# Patient Record
Sex: Female | Born: 1935 | Race: White | Hispanic: No | Smoking: Never smoker
Health system: Southern US, Community
[De-identification: ages and names within clinical notes are randomized; demographics above are authoritative.]

## PROBLEM LIST (undated history)

## (undated) DIAGNOSIS — M76899 Other specified enthesopathies of unspecified lower limb, excluding foot: Secondary | ICD-10-CM

## (undated) DIAGNOSIS — M545 Low back pain, unspecified: Secondary | ICD-10-CM

## (undated) DIAGNOSIS — N182 Chronic kidney disease, stage 2 (mild): Secondary | ICD-10-CM

## (undated) DIAGNOSIS — M25519 Pain in unspecified shoulder: Secondary | ICD-10-CM

## (undated) DIAGNOSIS — L858 Other specified epidermal thickening: Secondary | ICD-10-CM

## (undated) DIAGNOSIS — M6530 Trigger finger, unspecified finger: Secondary | ICD-10-CM

## (undated) DIAGNOSIS — K117 Disturbances of salivary secretion: Secondary | ICD-10-CM

## (undated) DIAGNOSIS — I5030 Unspecified diastolic (congestive) heart failure: Secondary | ICD-10-CM

## (undated) DIAGNOSIS — M48061 Spinal stenosis, lumbar region without neurogenic claudication: Secondary | ICD-10-CM

## (undated) DIAGNOSIS — I10 Essential (primary) hypertension: Secondary | ICD-10-CM

## (undated) DIAGNOSIS — L570 Actinic keratosis: Secondary | ICD-10-CM

## (undated) DIAGNOSIS — E039 Hypothyroidism, unspecified: Secondary | ICD-10-CM

## (undated) DIAGNOSIS — M1990 Unspecified osteoarthritis, unspecified site: Secondary | ICD-10-CM

## (undated) DIAGNOSIS — I442 Atrioventricular block, complete: Principal | ICD-10-CM

## (undated) DIAGNOSIS — H18609 Keratoconus, unspecified, unspecified eye: Secondary | ICD-10-CM

## (undated) DIAGNOSIS — I4891 Unspecified atrial fibrillation: Secondary | ICD-10-CM

## (undated) DIAGNOSIS — I872 Venous insufficiency (chronic) (peripheral): Secondary | ICD-10-CM

## (undated) DIAGNOSIS — H16209 Unspecified keratoconjunctivitis, unspecified eye: Secondary | ICD-10-CM

## (undated) DIAGNOSIS — L821 Other seborrheic keratosis: Secondary | ICD-10-CM

## (undated) DIAGNOSIS — E782 Mixed hyperlipidemia: Secondary | ICD-10-CM

## (undated) DIAGNOSIS — K219 Gastro-esophageal reflux disease without esophagitis: Secondary | ICD-10-CM

## (undated) DIAGNOSIS — L2089 Other atopic dermatitis: Secondary | ICD-10-CM

## (undated) DIAGNOSIS — R269 Unspecified abnormalities of gait and mobility: Secondary | ICD-10-CM

## (undated) DIAGNOSIS — R609 Edema, unspecified: Secondary | ICD-10-CM

## (undated) DIAGNOSIS — M25579 Pain in unspecified ankle and joints of unspecified foot: Secondary | ICD-10-CM

## (undated) DIAGNOSIS — R0902 Hypoxemia: Secondary | ICD-10-CM

## (undated) DIAGNOSIS — R1310 Dysphagia, unspecified: Secondary | ICD-10-CM

## (undated) DIAGNOSIS — Z950 Presence of cardiac pacemaker: Secondary | ICD-10-CM

## (undated) DIAGNOSIS — I5189 Other ill-defined heart diseases: Secondary | ICD-10-CM

## (undated) HISTORY — DX: Chronic kidney disease, stage 2 (mild): N18.2

## (undated) HISTORY — DX: Other ill-defined heart diseases: I51.89

## (undated) HISTORY — DX: Unspecified osteoarthritis, unspecified site: M19.90

## (undated) HISTORY — DX: Mixed hyperlipidemia: E78.2

## (undated) HISTORY — DX: Trigger finger, unspecified finger: M65.30

## (undated) HISTORY — DX: Unspecified atrial fibrillation: I48.91

## (undated) HISTORY — DX: Atrioventricular block, complete: I44.2

## (undated) HISTORY — DX: Unspecified keratoconjunctivitis, unspecified eye: H16.209

## (undated) HISTORY — DX: Edema, unspecified: R60.9

## (undated) HISTORY — DX: Hypoxemia: R09.02

## (undated) HISTORY — DX: Unspecified abnormalities of gait and mobility: R26.9

## (undated) HISTORY — DX: Spinal stenosis, lumbar region without neurogenic claudication: M48.061

## (undated) HISTORY — DX: Low back pain: M54.5

## (undated) HISTORY — DX: Presence of cardiac pacemaker: Z95.0

## (undated) HISTORY — DX: Low back pain, unspecified: M54.50

## (undated) HISTORY — DX: Keratoconus, unspecified, unspecified eye: H18.609

## (undated) HISTORY — DX: Pain in unspecified ankle and joints of unspecified foot: M25.579

## (undated) HISTORY — DX: Other specified enthesopathies of unspecified lower limb, excluding foot: M76.899

## (undated) HISTORY — DX: Essential (primary) hypertension: I10

## (undated) HISTORY — DX: Unspecified diastolic (congestive) heart failure: I50.30

## (undated) HISTORY — DX: Other specified epidermal thickening: L85.8

## (undated) HISTORY — DX: Venous insufficiency (chronic) (peripheral): I87.2

## (undated) HISTORY — DX: Disturbances of salivary secretion: K11.7

## (undated) HISTORY — DX: Other atopic dermatitis: L20.89

## (undated) HISTORY — DX: Hypothyroidism, unspecified: E03.9

## (undated) HISTORY — DX: Dysphagia, unspecified: R13.10

## (undated) HISTORY — DX: Actinic keratosis: L57.0

## (undated) HISTORY — DX: Pain in unspecified shoulder: M25.519

## (undated) HISTORY — DX: Gastro-esophageal reflux disease without esophagitis: K21.9

## (undated) HISTORY — DX: Other seborrheic keratosis: L82.1

---
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
## 1993-04-20 HISTORY — PX: TOTAL KNEE ARTHROPLASTY: SHX125

## 1999-11-11 ENCOUNTER — Encounter: Payer: Self-pay | Admitting: Internal Medicine

## 1999-11-11 ENCOUNTER — Encounter: Admission: RE | Admit: 1999-11-11 | Discharge: 1999-11-11 | Payer: Self-pay | Admitting: Internal Medicine

## 2000-02-26 ENCOUNTER — Encounter: Admission: RE | Admit: 2000-02-26 | Discharge: 2000-05-26 | Payer: Self-pay | Admitting: Internal Medicine

## 2000-11-04 ENCOUNTER — Encounter: Admission: RE | Admit: 2000-11-04 | Discharge: 2000-11-04 | Payer: Self-pay | Admitting: Internal Medicine

## 2000-11-04 ENCOUNTER — Encounter: Payer: Self-pay | Admitting: Internal Medicine

## 2000-12-09 ENCOUNTER — Encounter: Payer: Self-pay | Admitting: Specialist

## 2000-12-09 ENCOUNTER — Encounter: Admission: RE | Admit: 2000-12-09 | Discharge: 2000-12-09 | Payer: Self-pay | Admitting: Specialist

## 2000-12-30 ENCOUNTER — Encounter: Payer: Self-pay | Admitting: Specialist

## 2000-12-30 ENCOUNTER — Ambulatory Visit (HOSPITAL_COMMUNITY): Admission: RE | Admit: 2000-12-30 | Discharge: 2000-12-30 | Payer: Self-pay | Admitting: Specialist

## 2002-02-02 ENCOUNTER — Encounter: Admission: RE | Admit: 2002-02-02 | Discharge: 2002-02-02 | Payer: Self-pay | Admitting: Internal Medicine

## 2002-02-02 ENCOUNTER — Encounter: Payer: Self-pay | Admitting: Internal Medicine

## 2002-04-20 HISTORY — PX: PACEMAKER PLACEMENT: SHX43

## 2003-04-21 HISTORY — PX: TOTAL KNEE ARTHROPLASTY: SHX125

## 2003-08-14 ENCOUNTER — Inpatient Hospital Stay (HOSPITAL_COMMUNITY): Admission: RE | Admit: 2003-08-14 | Discharge: 2003-08-20 | Payer: Self-pay | Admitting: Specialist

## 2003-09-14 ENCOUNTER — Ambulatory Visit (HOSPITAL_COMMUNITY): Admission: RE | Admit: 2003-09-14 | Discharge: 2003-09-14 | Payer: Self-pay | Admitting: Unknown Physician Specialty

## 2003-10-31 ENCOUNTER — Ambulatory Visit (HOSPITAL_COMMUNITY): Admission: RE | Admit: 2003-10-31 | Discharge: 2003-10-31 | Payer: Self-pay | Admitting: Pulmonary Disease

## 2003-11-19 ENCOUNTER — Ambulatory Visit (HOSPITAL_COMMUNITY): Admission: RE | Admit: 2003-11-19 | Discharge: 2003-11-20 | Payer: Self-pay | Admitting: Internal Medicine

## 2003-12-05 ENCOUNTER — Ambulatory Visit (HOSPITAL_COMMUNITY): Admission: RE | Admit: 2003-12-05 | Discharge: 2003-12-05 | Payer: Self-pay | Admitting: *Deleted

## 2004-03-06 ENCOUNTER — Ambulatory Visit: Payer: Self-pay | Admitting: Internal Medicine

## 2004-03-26 ENCOUNTER — Ambulatory Visit: Payer: Self-pay | Admitting: Internal Medicine

## 2004-05-21 ENCOUNTER — Ambulatory Visit: Payer: Self-pay | Admitting: Internal Medicine

## 2004-07-02 ENCOUNTER — Ambulatory Visit: Payer: Self-pay | Admitting: Internal Medicine

## 2004-08-07 ENCOUNTER — Ambulatory Visit: Payer: Self-pay | Admitting: Internal Medicine

## 2004-09-30 ENCOUNTER — Ambulatory Visit: Payer: Self-pay | Admitting: Internal Medicine

## 2004-11-20 ENCOUNTER — Ambulatory Visit: Payer: Self-pay | Admitting: Internal Medicine

## 2005-01-21 ENCOUNTER — Ambulatory Visit: Payer: Self-pay | Admitting: Internal Medicine

## 2005-01-21 ENCOUNTER — Ambulatory Visit: Payer: Self-pay

## 2005-02-14 IMAGING — CR DG CHEST 2V
2 series · 2 of 2 positions shown · non-contrast
Comparison: 08/08/03.

CLINICAL DATA: Bradycardia.  Status-post pacemaker insertion.  
 TWO VIEW CHEST

[view not recorded (1 of 2)]
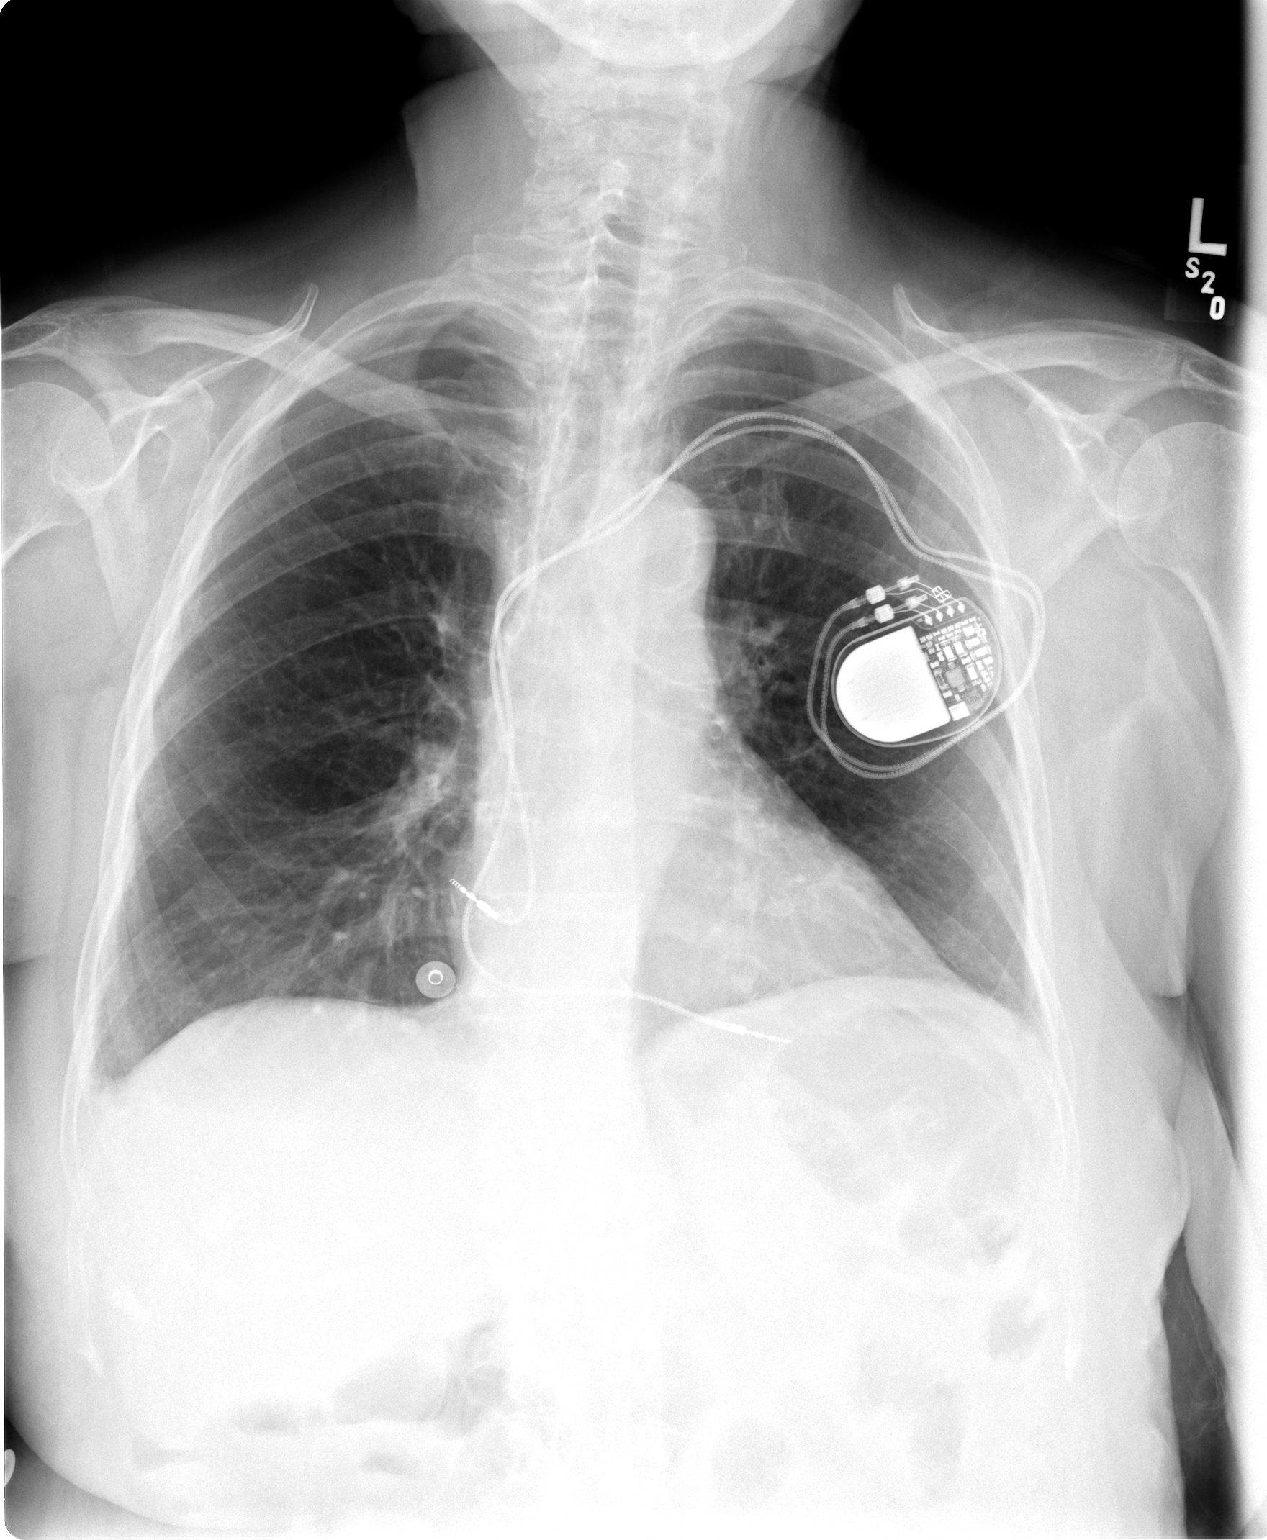

[view not recorded (2 of 2)]
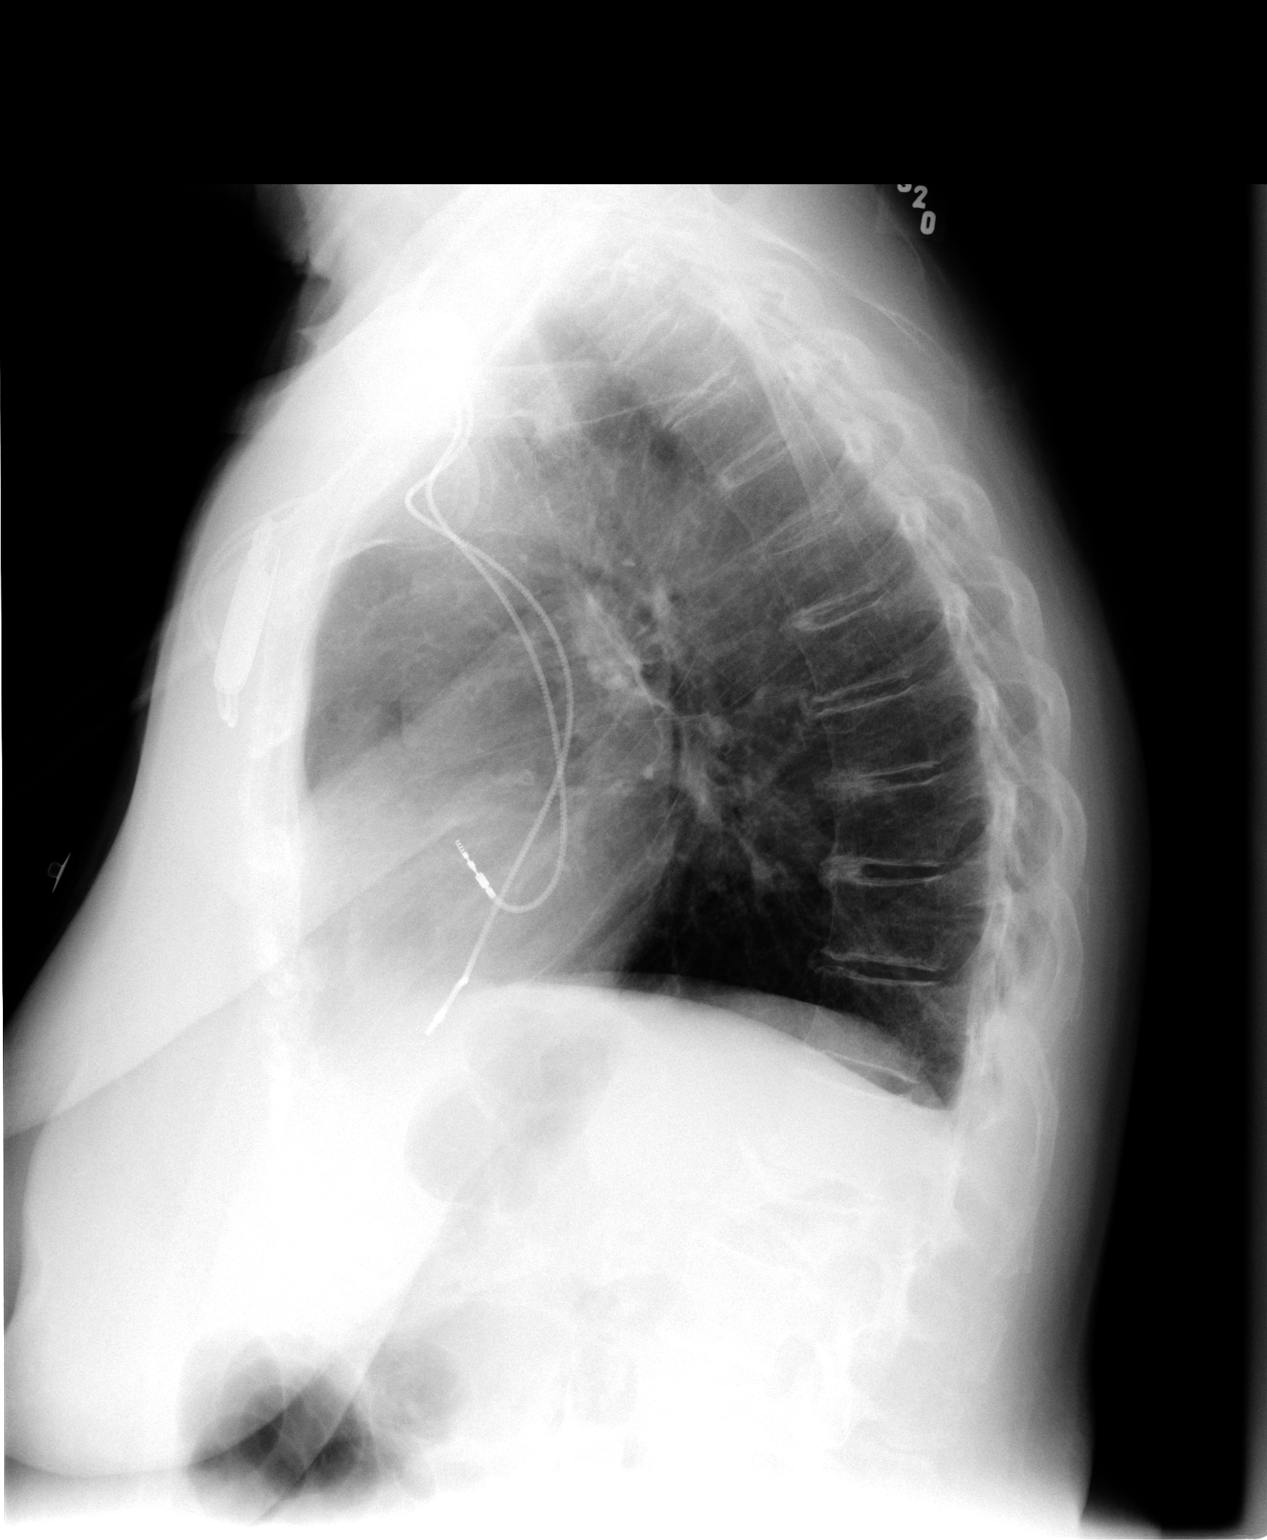

[2 of 2 positions shown; findings below may reference images not displayed]

FINDINGS: Two view exam of the chest shows no focal consolidation, edema, or effusion.  There is a left-sided permanent pacemaker new in the interval without evidence for pneumothorax.  There is a tiny right pleural effusion.  Cardiopericardial silhouette is within normal limits for size. 
 IMPRESSION
 1.  No evidence for pneumothorax. 
 2.  Tiny right pleural effusion.

## 2005-02-19 ENCOUNTER — Ambulatory Visit: Payer: Self-pay | Admitting: Internal Medicine

## 2005-05-05 ENCOUNTER — Ambulatory Visit: Payer: Self-pay | Admitting: Internal Medicine

## 2005-05-08 ENCOUNTER — Ambulatory Visit: Payer: Self-pay | Admitting: Emergency Medicine

## 2005-05-12 ENCOUNTER — Ambulatory Visit: Payer: Self-pay | Admitting: *Deleted

## 2005-06-05 ENCOUNTER — Ambulatory Visit: Payer: Self-pay | Admitting: Internal Medicine

## 2005-08-11 ENCOUNTER — Ambulatory Visit: Payer: Self-pay | Admitting: Internal Medicine

## 2005-10-14 ENCOUNTER — Ambulatory Visit: Payer: Self-pay | Admitting: Internal Medicine

## 2006-01-14 ENCOUNTER — Ambulatory Visit: Payer: Self-pay | Admitting: Internal Medicine

## 2006-03-16 ENCOUNTER — Ambulatory Visit: Payer: Self-pay | Admitting: Internal Medicine

## 2006-03-16 LAB — CONVERTED CEMR LAB: Hgb A1c MFr Bld: 5.9 % (ref 4.6–6.0)

## 2006-05-19 ENCOUNTER — Encounter
Admission: RE | Admit: 2006-05-19 | Discharge: 2006-05-19 | Payer: Self-pay | Admitting: Physical Medicine and Rehabilitation

## 2006-06-22 ENCOUNTER — Ambulatory Visit: Payer: Self-pay | Admitting: Internal Medicine

## 2006-07-26 ENCOUNTER — Ambulatory Visit: Payer: Self-pay | Admitting: Internal Medicine

## 2006-07-26 LAB — CONVERTED CEMR LAB
Basophils Absolute: 0 10*3/uL (ref 0.0–0.1)
Basophils Relative: 0.1 % (ref 0.0–1.0)
CO2: 29 meq/L (ref 19–32)
Chloride: 100 meq/L (ref 96–112)
Creatinine, Ser: 1.3 mg/dL — ABNORMAL HIGH (ref 0.4–1.2)
Eosinophils Relative: 0.1 % (ref 0.0–5.0)
HCT: 33.8 % — ABNORMAL LOW (ref 36.0–46.0)
Hemoglobin: 11.5 g/dL — ABNORMAL LOW (ref 12.0–15.0)
MCHC: 33.9 g/dL (ref 30.0–36.0)
Monocytes Absolute: 0.8 10*3/uL — ABNORMAL HIGH (ref 0.2–0.7)
Neutrophils Relative %: 71.6 % (ref 43.0–77.0)
Potassium: 4.5 meq/L (ref 3.5–5.1)
RBC: 3.55 M/uL — ABNORMAL LOW (ref 3.87–5.11)
RDW: 13.2 % (ref 11.5–14.6)
Sodium: 138 meq/L (ref 135–145)
TSH: 3.26 microintl units/mL (ref 0.35–5.50)
WBC: 5.5 10*3/uL (ref 4.5–10.5)

## 2006-09-27 ENCOUNTER — Ambulatory Visit: Payer: Self-pay | Admitting: Internal Medicine

## 2006-10-18 ENCOUNTER — Ambulatory Visit: Payer: Self-pay | Admitting: Internal Medicine

## 2006-11-24 DIAGNOSIS — J45909 Unspecified asthma, uncomplicated: Secondary | ICD-10-CM | POA: Insufficient documentation

## 2006-11-24 DIAGNOSIS — M1990 Unspecified osteoarthritis, unspecified site: Secondary | ICD-10-CM | POA: Insufficient documentation

## 2006-11-24 DIAGNOSIS — R609 Edema, unspecified: Secondary | ICD-10-CM

## 2006-11-24 DIAGNOSIS — E039 Hypothyroidism, unspecified: Secondary | ICD-10-CM

## 2006-12-23 ENCOUNTER — Ambulatory Visit: Payer: Self-pay | Admitting: Internal Medicine

## 2006-12-23 DIAGNOSIS — I10 Essential (primary) hypertension: Secondary | ICD-10-CM | POA: Insufficient documentation

## 2007-01-04 ENCOUNTER — Encounter: Payer: Self-pay | Admitting: Internal Medicine

## 2007-01-04 ENCOUNTER — Ambulatory Visit: Payer: Self-pay | Admitting: Internal Medicine

## 2007-01-21 ENCOUNTER — Telehealth: Payer: Self-pay | Admitting: *Deleted

## 2007-03-29 ENCOUNTER — Ambulatory Visit: Payer: Self-pay | Admitting: Internal Medicine

## 2007-03-31 ENCOUNTER — Encounter: Payer: Self-pay | Admitting: Internal Medicine

## 2007-05-09 ENCOUNTER — Ambulatory Visit: Payer: Self-pay | Admitting: Internal Medicine

## 2007-05-31 ENCOUNTER — Ambulatory Visit: Payer: Self-pay | Admitting: Internal Medicine

## 2007-05-31 DIAGNOSIS — J4489 Other specified chronic obstructive pulmonary disease: Secondary | ICD-10-CM | POA: Insufficient documentation

## 2007-05-31 DIAGNOSIS — J449 Chronic obstructive pulmonary disease, unspecified: Secondary | ICD-10-CM

## 2007-08-30 ENCOUNTER — Ambulatory Visit: Payer: Self-pay | Admitting: Internal Medicine

## 2007-09-08 ENCOUNTER — Ambulatory Visit: Payer: Self-pay

## 2007-11-24 ENCOUNTER — Ambulatory Visit: Payer: Self-pay | Admitting: Internal Medicine

## 2007-11-24 DIAGNOSIS — R197 Diarrhea, unspecified: Secondary | ICD-10-CM | POA: Insufficient documentation

## 2008-01-16 ENCOUNTER — Ambulatory Visit: Payer: Self-pay | Admitting: Internal Medicine

## 2008-01-16 ENCOUNTER — Ambulatory Visit: Payer: Self-pay

## 2008-02-28 ENCOUNTER — Ambulatory Visit: Payer: Self-pay | Admitting: Internal Medicine

## 2008-02-28 LAB — CONVERTED CEMR LAB
BUN: 34 mg/dL — ABNORMAL HIGH (ref 6–23)
Calcium: 10.1 mg/dL (ref 8.4–10.5)
Eosinophils Absolute: 0 10*3/uL (ref 0.0–0.7)
Eosinophils Relative: 0.1 % (ref 0.0–5.0)
GFR calc Af Amer: 46 mL/min
GFR calc non Af Amer: 38 mL/min
Glucose, Bld: 100 mg/dL — ABNORMAL HIGH (ref 70–99)
HCT: 35.2 % — ABNORMAL LOW (ref 36.0–46.0)
Hemoglobin: 12.2 g/dL (ref 12.0–15.0)
MCV: 97.7 fL (ref 78.0–100.0)
Monocytes Absolute: 0.6 10*3/uL (ref 0.1–1.0)
Monocytes Relative: 12.1 % — ABNORMAL HIGH (ref 3.0–12.0)
Neutro Abs: 3.2 10*3/uL (ref 1.4–7.7)
RDW: 12.2 % (ref 11.5–14.6)
Sodium: 139 meq/L (ref 135–145)

## 2008-04-20 DIAGNOSIS — I872 Venous insufficiency (chronic) (peripheral): Secondary | ICD-10-CM

## 2008-04-20 HISTORY — DX: Venous insufficiency (chronic) (peripheral): I87.2

## 2008-04-26 ENCOUNTER — Ambulatory Visit: Payer: Self-pay | Admitting: Internal Medicine

## 2008-06-04 ENCOUNTER — Ambulatory Visit: Payer: Self-pay | Admitting: Internal Medicine

## 2008-06-08 LAB — CONVERTED CEMR LAB
BUN: 32 mg/dL — ABNORMAL HIGH (ref 6–23)
Calcium: 9.7 mg/dL (ref 8.4–10.5)
GFR calc Af Amer: 46 mL/min
Glucose, Bld: 108 mg/dL — ABNORMAL HIGH (ref 70–99)

## 2008-06-12 ENCOUNTER — Ambulatory Visit: Payer: Self-pay | Admitting: Internal Medicine

## 2008-06-13 ENCOUNTER — Encounter: Payer: Self-pay | Admitting: Internal Medicine

## 2008-06-13 LAB — CONVERTED CEMR LAB
BUN: 39 mg/dL — ABNORMAL HIGH (ref 6–23)
Calcium: 9.1 mg/dL (ref 8.4–10.5)
Creatinine, Ser: 1.6 mg/dL — ABNORMAL HIGH (ref 0.4–1.2)
GFR calc Af Amer: 39 mL/min
Glucose, Bld: 110 mg/dL — ABNORMAL HIGH (ref 70–99)

## 2008-08-13 ENCOUNTER — Ambulatory Visit: Payer: Self-pay | Admitting: Internal Medicine

## 2008-08-13 LAB — CONVERTED CEMR LAB
BUN: 36 mg/dL — ABNORMAL HIGH (ref 6–23)
Basophils Relative: 0.9 % (ref 0.0–3.0)
Eosinophils Absolute: 0 10*3/uL (ref 0.0–0.7)
GFR calc non Af Amer: 34.69 mL/min (ref >60)
MCHC: 34.1 g/dL (ref 30.0–36.0)
MCV: 95.3 fL (ref 78.0–100.0)
Monocytes Absolute: 0.3 10*3/uL (ref 0.1–1.0)
Neutrophils Relative %: 75.1 % (ref 43.0–77.0)
Platelets: 132 10*3/uL — ABNORMAL LOW (ref 150.0–400.0)
Potassium: 4.2 meq/L (ref 3.5–5.1)
Pro B Natriuretic peptide (BNP): 173 pg/mL — ABNORMAL HIGH (ref 0.0–100.0)
RDW: 12.5 % (ref 11.5–14.6)

## 2008-08-28 ENCOUNTER — Encounter: Payer: Self-pay | Admitting: Internal Medicine

## 2008-09-07 ENCOUNTER — Encounter: Payer: Self-pay | Admitting: Internal Medicine

## 2008-09-13 ENCOUNTER — Ambulatory Visit: Payer: Self-pay | Admitting: Internal Medicine

## 2008-10-08 ENCOUNTER — Encounter: Payer: Self-pay | Admitting: Internal Medicine

## 2008-10-15 ENCOUNTER — Ambulatory Visit: Payer: Self-pay | Admitting: Internal Medicine

## 2008-12-17 ENCOUNTER — Telehealth: Payer: Self-pay | Admitting: Internal Medicine

## 2009-01-17 ENCOUNTER — Ambulatory Visit: Payer: Self-pay | Admitting: Internal Medicine

## 2009-01-17 LAB — CONVERTED CEMR LAB
Calcium: 9.7 mg/dL (ref 8.4–10.5)
Chloride: 99 meq/L (ref 96–112)
Creatinine, Ser: 1.6 mg/dL — ABNORMAL HIGH (ref 0.4–1.2)
Free T4: 1.3 ng/dL (ref 0.6–1.6)
TSH: 4.91 microintl units/mL (ref 0.35–5.50)

## 2009-03-26 ENCOUNTER — Ambulatory Visit: Payer: Self-pay | Admitting: Internal Medicine

## 2009-03-26 DIAGNOSIS — I442 Atrioventricular block, complete: Secondary | ICD-10-CM | POA: Insufficient documentation

## 2009-03-26 DIAGNOSIS — I5032 Chronic diastolic (congestive) heart failure: Secondary | ICD-10-CM

## 2009-03-26 HISTORY — DX: Atrioventricular block, complete: I44.2

## 2009-04-08 ENCOUNTER — Telehealth: Payer: Self-pay | Admitting: Internal Medicine

## 2009-04-20 DIAGNOSIS — H16209 Unspecified keratoconjunctivitis, unspecified eye: Secondary | ICD-10-CM

## 2009-04-20 HISTORY — DX: Unspecified keratoconjunctivitis, unspecified eye: H16.209

## 2009-06-28 ENCOUNTER — Encounter (INDEPENDENT_AMBULATORY_CARE_PROVIDER_SITE_OTHER): Payer: Self-pay | Admitting: *Deleted

## 2009-07-01 ENCOUNTER — Ambulatory Visit: Payer: Self-pay | Admitting: Internal Medicine

## 2009-07-08 ENCOUNTER — Encounter: Payer: Self-pay | Admitting: Internal Medicine

## 2009-11-12 ENCOUNTER — Ambulatory Visit: Payer: Self-pay | Admitting: Internal Medicine

## 2009-11-28 ENCOUNTER — Encounter: Admission: RE | Admit: 2009-11-28 | Discharge: 2009-11-28 | Payer: Self-pay | Admitting: Internal Medicine

## 2009-12-04 ENCOUNTER — Ambulatory Visit (HOSPITAL_COMMUNITY): Admission: RE | Admit: 2009-12-04 | Discharge: 2009-12-04 | Payer: Self-pay | Admitting: Internal Medicine

## 2010-02-13 ENCOUNTER — Ambulatory Visit: Payer: Self-pay | Admitting: Internal Medicine

## 2010-03-12 ENCOUNTER — Encounter: Payer: Self-pay | Admitting: Internal Medicine

## 2010-04-11 ENCOUNTER — Encounter
Admission: RE | Admit: 2010-04-11 | Discharge: 2010-04-11 | Payer: Self-pay | Source: Home / Self Care | Attending: Internal Medicine | Admitting: Internal Medicine

## 2010-04-20 HISTORY — PX: CATARACT EXTRACTION W/ INTRAOCULAR LENS IMPLANT: SHX1309

## 2010-05-11 ENCOUNTER — Encounter: Payer: Self-pay | Admitting: Pulmonary Disease

## 2010-05-11 ENCOUNTER — Encounter: Payer: Self-pay | Admitting: Internal Medicine

## 2010-05-20 NOTE — Assessment & Plan Note (Signed)
Summary: device/saf  Medications Added FUROSEMIDE 40 MG TABS (FUROSEMIDE) one tablet once daily      Allergies Added: NKDA  CC:  device check.  History of Present Illness:    Sabrina Bishop is seen in followup for complete heart block for which she underwent pacemaker implantation. She has history of normal left ventricular function  She continues to complain of dyspnea on exertion. She also has chronic edema.   Her other major limitation however is her back and she is not entirely sure that the dyspnea supersedes her back in terms of limitations although she thinks that this is the case.  Current Medications (verified): 1)  Atenolol 50 Mg Tabs (Atenolol) .... Once Daily 2)  Atacand 16 Mg Tabs (Candesartan Cilexetil) .... 1/2 Once Daily 3)  Furosemide 40 Mg Tabs (Furosemide) .... One Tablet Once Daily 4)  Klor-Con M10 10 Meq Tbcr (Potassium Chloride Crys Cr) .... Once Daily 5)  Levoxyl 112 Mcg Tabs (Levothyroxine Sodium) .... Once Daily 6)  Imdur 30 Mg  Tb24 (Isosorbide Mononitrate) .... 1/2 Once Daily 7)  Hydrocodone-Acetaminophen 7.5-500 Mg  Tabs (Hydrocodone-Acetaminophen) .... Three Times A Day-Qid 8)  Multivitamins   Tabs (Multiple Vitamin) .... Once Daily 9)  Calcium 500/d 500-200 Mg-Unit  Tabs (Calcium Carbonate-Vitamin D) .... Once Daily 10)  Spiriva Handihaler 18 Mcg Caps (Tiotropium Bromide Monohydrate) .... Once Daily  Allergies (verified): No Known Drug Allergies  Past History:  Past Medical History: Last updated: 06/12/2008 Asthma Hypothyroidism Osteoarthritis Hypertension diastollic disfunction  Past Surgical History: Last updated: 11/24/2006 TKR-right  Family History: Last updated: 11/24/2007 Family History High cholesterol Family History Hypertension Family History Osteoporosis  Social History: Last updated: 12/23/2006 Retired Single Never Smoked  Vital Signs:  Patient profile:   77 year old female Height:      57 inches Weight:      144  pounds BMI:     31.27 Pulse rate:   73 / minute Pulse rhythm:   regular BP sitting:   105 / 64  (left arm) Cuff size:   regular  Vitals Entered By: Judithe Modest CMA (November 12, 2009 10:46 AM)  Physical Exam  General:  The patient was alert and oriented in no acute distress. HEENT Normal.  Neck veins were flat, carotids were brisk.  Lungs were clear.  Heart sounds were regular without murmurs or gallops.  Abdomen was soft with active bowel sounds. There is no clubbing cyanosis; trace edema and quite tender to touch Skin Warm and dry    PPM Specifications Following MD:  Sherryl Manges, MD     PPM Vendor:  Medtronic     PPM Model Number:  [id number]     PPM Serial Number:  [id number] PPM DOI:  11/19/2003     PPM Implanting MD:  Sherryl Manges, MD  Lead 1    Location: RA     DOI: 11/19/2003     Model #: [id number]     Serial #: [id number]     Status: active Lead 2    Location: RV     DOI: 11/19/2003     Model #: [id number]     Serial #: [id number]     Status: active  Magnet Response Rate:  BOL 85 ERI 65  Indications:  2:1HB   PPM Follow Up Remote Check?  No Battery Voltage:  2.72 V     Battery Est. Longevity:  2.5 years     Pacer Dependent:  Yes       PPM Device  Measurements Atrium  Amplitude: 2.0 mV, Impedance: 441 ohms, Threshold: 1.0 V at 0.4 msec Right Ventricle  Impedance: 506 ohms, Threshold: 0.75 V at 0.4 msec  Episodes MS Episodes:  13     Percent Mode Switch:  <0.1%     Coumadin:  No Ventricular High Rate:  0     Atrial Pacing:  11.4%     Ventricular Pacing:  99.6%  Parameters Mode:  DDDR     Lower Rate Limit:  50     Upper Rate Limit:  130 Paced AV Delay:  180     Sensed AV Delay:  150 Next Remote Date:  02/13/2010     Next Cardiology Appt Due:  10/19/2010 Tech Comments:  No parameter changes.  Device function normal.  [facility] transmissions every 3 months.  13 mode switch episodes the longest 46 seconds, - coumadin.  ROV 1 year with Dr. Graciela Husbands. Altha Harm, LPN  November 12, 2009  10:56 AM   Impression & Recommendations:  Problem # 1:  PACEMAKER  DDD MDT (ICD-V45.01) Device parameters and data were reviewed and no changes were made  Problem # 2:  AV BLOCK, COMPLETE (ICD-426.0) stable post pacer Her updated medication list for this problem includes:    Atenolol 50 Mg Tabs (Atenolol) ..... Once daily    Imdur 30 Mg Tb24 (Isosorbide mononitrate) .Marland Kitchen... 1/2 once daily  Patient Instructions: 1)  Your physician wants you to follow-up in:12 months with Dr Graciela Husbands.   You will receive a reminder letter in the mail two months in advance. If you don't receive a letter, please call our office to schedule the follow-up appointment.

## 2010-05-20 NOTE — Cardiovascular Report (Signed)
Summary: Office Visit   Office Visit   Imported By: Roderic Ovens 11/13/2009 16:15:11  _____________________________________________________________________  External Attachment:    Type:   Image     Comment:   External Document

## 2010-05-20 NOTE — Letter (Signed)
Summary: Device-Delinquent Phone Journalist, newspaper, Main Office  [address]   [place], [place] [address]   Phone: [phone]  Fax: [phone]     June 28, 2009 MRN: [id number]   [facility] [address] [place], [place]  [address]   Dear Ms. Donoghue,  According to our records, you were scheduled for a device phone transmission on                              .   06/26/2009  We did not receive any results from this check.  If you transmitted on your scheduled day, please call us to help troubleshoot your system.  If you forgot to send your transmission, please send one upon receipt of this letter.  Thank you,  Altha Harm, LPN  June 28, 2009 8:53 AM  [facility]

## 2010-05-20 NOTE — Cardiovascular Report (Signed)
Summary: Office Visit Remote   Office Visit Remote   Imported By: Roderic Ovens 03/24/2010 14:59:21  _____________________________________________________________________  External Attachment:    Type:   Image     Comment:   External Document

## 2010-05-20 NOTE — Letter (Signed)
Summary: Remote Device Check  [employer], Main Office  [address]   [place], [place] [address]   Phone: [phone]  Fax: [phone]     July 08, 2009 MRN: [id number]   [facility] [address] [place], [place]  [address]   Dear Ms. Haviland,   Your remote transmission was recieved and reviewed by your physician.  All diagnostics were within normal limits for you.    __X____Your next office visit is scheduled for:    JUNE 2011 WITH DR Graciela Husbands.  Please call our office to schedule an appointment.    Sincerely,  Proofreader

## 2010-05-20 NOTE — Cardiovascular Report (Signed)
Summary: Office Visit Remote   Office Visit Remote   Imported By: Roderic Ovens 07/08/2009 14:04:51  _____________________________________________________________________  External Attachment:    Type:   Image     Comment:   External Document

## 2010-05-20 NOTE — Letter (Signed)
Summary: Remote Device Check  [employer], Main Office  [address]   [place], [place] [address]   Phone: [phone]  Fax: [phone]     March 12, 2010 MRN: [id number]   [facility] [address] [place], [place]  [address]   Dear Ms. Manard,   Your remote transmission was recieved and reviewed by your physician.  All diagnostics were within normal limits for you.  _____Your next transmission is scheduled for: 05-22-2010.  Please transmit at any time this day.  If you have a wireless device your transmission will be sent automatically.   Sincerely,  Vella Kohler

## 2010-05-21 ENCOUNTER — Encounter: Payer: Self-pay | Admitting: Internal Medicine

## 2010-05-22 ENCOUNTER — Encounter (INDEPENDENT_AMBULATORY_CARE_PROVIDER_SITE_OTHER): Payer: Medicare Other

## 2010-05-22 ENCOUNTER — Ambulatory Visit: Admit: 2010-05-22 | Payer: Self-pay | Admitting: Internal Medicine

## 2010-05-22 DIAGNOSIS — I495 Sick sinus syndrome: Secondary | ICD-10-CM

## 2010-06-03 ENCOUNTER — Other Ambulatory Visit: Payer: Self-pay | Admitting: Internal Medicine

## 2010-06-09 ENCOUNTER — Encounter (INDEPENDENT_AMBULATORY_CARE_PROVIDER_SITE_OTHER): Payer: Self-pay | Admitting: *Deleted

## 2010-06-17 NOTE — Letter (Signed)
Summary: Remote Device Check  [employer], Main Office  [address]   [place], [place] [address]   Phone: [phone]  Fax: [phone]     June 09, 2010 MRN: [id number]   [facility] [address] [place], [place]  [address]   Dear Sabrina Bishop,   Your remote transmission was recieved and reviewed by your physician.  All diagnostics were within normal limits for you.  __X___Your next transmission is scheduled for:  08-21-2010.  Please transmit at any time this day.  If you have a wireless device your transmission will be sent automatically.   Sincerely,  Vella Kohler

## 2010-06-17 NOTE — Cardiovascular Report (Signed)
Summary: Office Visit Remote   Office Visit Remote   Imported By: Roderic Ovens 06/13/2010 11:46:26  _____________________________________________________________________  External Attachment:    Type:   Image     Comment:   External Document

## 2010-08-21 ENCOUNTER — Encounter: Payer: Medicare Other | Admitting: *Deleted

## 2010-08-24 ENCOUNTER — Encounter: Payer: Self-pay | Admitting: *Deleted

## 2010-08-25 ENCOUNTER — Ambulatory Visit (INDEPENDENT_AMBULATORY_CARE_PROVIDER_SITE_OTHER): Payer: Medicare Other | Admitting: *Deleted

## 2010-08-25 ENCOUNTER — Other Ambulatory Visit: Payer: Self-pay

## 2010-08-25 DIAGNOSIS — I442 Atrioventricular block, complete: Secondary | ICD-10-CM

## 2010-08-25 DIAGNOSIS — I498 Other specified cardiac arrhythmias: Secondary | ICD-10-CM

## 2010-08-25 DIAGNOSIS — R001 Bradycardia, unspecified: Secondary | ICD-10-CM

## 2010-08-28 NOTE — Progress Notes (Signed)
Pacer remote check  

## 2010-09-02 NOTE — Assessment & Plan Note (Signed)
[facility]                         ELECTROPHYSIOLOGY OFFICE NOTE   NAME:Sabrina Bishop, Sabrina Bishop                  MRN:          [id number]  DATE:06/04/2008                            DOB:          [birth year]    Sabrina Bishop is seen in follow-up for pacemaker implanted for high-grade  heart block.  She is 100% ventricularly paced and 10% atrially paced.  Her major complaint is shortness of breath with exertion.  This has been  getting worse.  Dr. Lovell Sheehan has identified emphysema as well as kyphosis  as contributing factors.  She also has had in the past normal left  ventricular function with some filling abnormalities.   She has longstanding hypertension.  Diuretics increased a few years ago  helped some but there has been a diminution in the amount of urine that  she empties daily.   Her medications currently include the Lasix at 40, atenolol 50, Atacand  at 8, potassium 10, Imdur 15, and Levoxyl 112.   PHYSICAL EXAMINATION:  VITAL SIGNS:  Her blood pressure today is 139/75,  her pulse is 82.  LUNGS: Clear.  Her neck veins are 7-8 cm.  CARDIAC:  Her heart sounds are regular.  EXTREMITIES:  Have 2+ peripheral edema bilaterally.   Interrogation of her Medtronic pulse generator demonstrated the  aforementioned pacing data. The P-wave was to impedance of 434,  threshold was 0.1 volt, 0.4 at the atrium, 0.5 and 0.4 in the ventricle,  with impedance of 491.  There was no intrinsic ventricular rhythm.   IMPRESSION:  1. Complete heart block.  2. Previously normal left ventricular function, question change.  3. Congestive heart failure manifested by dyspnea on exertion and      peripheral edema.  4. Question emphysema.   We will plan today to get a BNP and BMP.  I have also asked that she  double her Lasix between now and when she sees Dr. Lovell Sheehan in the next  week or so as well stop her Spiriva. This will hopefully help [place]  understand whether her  dyspnea is more pulmonary or cardiac.   It may well be that an echo would be of benefit given her 100%  ventricular pacing; it has been about 5 years since her LV function was  investigated.   If her dyspnea persists, I will ask Dr. Lovell Sheehan to get this.   We will see her again in 6 months' time.     Duke Salvia, MD, [facility]  Electronically Signed    SCK/MedQ  DD: 06/04/2008  DT: 06/04/2008  Job #: [id number]   cc:   Stacie Glaze, MD

## 2010-09-02 NOTE — Letter (Signed)
January 04, 2007    Stacie Glaze, MD  [address] [place], [place] [address]   RE:  ZAMYAH, WIESMAN  MRN:  [id number]  /  DOB:  12/05/1916   Dear Jonny Ruiz:   It was a pleasure to see Kyri Shader today.  She has a pacemaker  implanted for first-degree AV block.  She now has complete heart block  and is device-dependent.   Her major complaint is shortness of breath.  She also has peripheral  edema which is dependent.  She eats salt without any significant  restriction.   MEDICATIONS:  1. Lasix 40 mg and potassium 20 mEq, both once daily.  2. Atenolol; she takes 25 mg a day.  3. Atacand 16.  4. Levoxyl 112 mcg.   PHYSICAL EXAMINATION:  Her blood pressure was 130/66, her pulse was 80.  Her weight was 139.  Her neck veins were 7-8 cm.  Her lungs were clear.  Heart sounds were regular.  The extremities had 1 to 2+ bilateral edema.   Interrogation of her Medtronic Kappa 901 pulse generator demonstrates a  P wave of 2 with impedance of 423, a threshold of 1 volt at 0.4.  There  was no intrinsic ventricular rhythm.  The impedance was 466 and the  threshold was 0.75 at 0.4.  She is 100% ventricularly paced.   IMPRESSION:  1. High-grade first-degree atrioventricular block, now complete heart      block.  2. Status post pacer for #1, now device dependent.  3. Diastolic dysfunction with diastolic heart failure.  4. Treated hypothyroidism.   John, Mrs. Pio is doing fine.  I have asked her to try to restrict  the sodium in her diet.   I have suggested that if that does not work in terms of modulating her  edema, to take Lasix 40 twice a day 3 days a week and double her  potassium at the same time.   We will continue to follow her remotely and we will see her again in 1  year's time.    Sincerely,      Duke Salvia, MD, [facility]  Electronically Signed    SCK/MedQ  DD: 01/04/2007  DT: 01/05/2007  Job #: [id number]

## 2010-09-05 NOTE — Discharge Summary (Signed)
Sabrina Bishop, Sabrina Bishop                     ACCOUNT NO.:  [id number]   MEDICAL RECORD NO.:  [id number]                   PATIENT TYPE:  INP   LOCATION:  0464                                 FACILITY:  [facility]   PHYSICIAN:  Erasmo Leventhal, M.D.         DATE OF BIRTH:  27-Feb-1917   DATE OF ADMISSION:  08/14/2003  DATE OF DISCHARGE:  08/20/2003                                 DISCHARGE SUMMARY   ADMISSION DIAGNOSIS:  Left knee osteoarthritis.   DISCHARGE DIAGNOSIS:  Left knee osteoarthritis.   OPERATION:  Total knee arthroplasty, left knee.   BRIEF HISTORY:  This is an 77 year old lady with history of bilateral knee  osteoarthritis who underwent previous total knee arthroplasty of the right  knee with good result, and due to failure of conservative management for her  left knee, requests total knee arthroplasty of the left knee. The surgery  risks, benefits, and aftercare were discussed in detail with the patient and  questions provided and answered and surgery to go ahead as scheduled. The  patient's medical doctor is Dr. Darryll Capers. Medical clearance was obtained  prior to surgery, and she will be followed in the hospital by the medical  serviced for medical management.   PAST MEDICAL HISTORY:  1. Hypertension.  2. Hypothyroidism.  3. History of skin cancer.   LABORATORY DATA:  Her admission CBC showed hemoglobin low at 11.8,  hematocrit low at 34.9, white count low at 3.0. Her admission CMET showed  glucose high at 144, otherwise within normal limits. Admission PT was 12.2  with INR of 0.9, and admission PTT was normal at 32. She reached a low on  her hemoglobin and hematocrit of 8.2 and 23.8 on April 28 and subsequently  had transfusion of 2 units of packed cells, and her hemoglobin and  hematocrit on April 29 was 11.6 and 33.5. During her hospitalization, her  glucose remained mildly elevated. On discharge, PT was 18.3 with an INR of  1.8 on her Coumadin.   COURSE IN THE HOSPITAL:  The patient tolerated the operative procedure well.  The first postoperative day, she was noted to have moderate drainage in her  Hemovac, and the Hemovac was removed without difficulty. Her BMET showed the  sodium to be slightly low at 133, glucose high at 173. She was subsequently  started on her Coumadin protocol. She was noted to have moderate ecchymosis  on the operative leg, but the skin was in good repair. The second  postoperative day, the patient continued to do well. Her hyponatremia was  managed by the medical service without difficulty. Coumadin protocol was  started. The second postoperative day, vital signs were stable, and she was  afebrile. Hemoglobin was 8.2 and hematocrit 23.8. Her PT/INR was 17.4 with  INR 1.7. Her lungs were clear. Her heart sounds were normal. Bowel sounds  were sluggish. Her PCA was discontinued. She was switched to p.o.  medications and transfusion was subsequently ordered.  The third  postoperative day, the patient continued to make progress. Her vital signs  were stable. Blood pressure 155/68. Vital signs stable. She was afebrile.  Dressing was changed. Her ecchymosis continued but was slowly clearing, and  her skin was viable. Her wound was benign. Calves were negative. Lungs were  clear. She remained with mild elevation of her glucose at 115. PTT at 19.9  with INR of 2.1. Care management was involved at this time to help find  nursing home placement as the patient does live at home and was not able to  go to home on her own at this time. On fifth postoperative day, the patient  remained stable. Vital signs remained stable. She was afebrile. Dressing was  changed. Wound was benign. Hemoglobin and hematocrit were stable. On sixth  postoperative day, the patient was awake, alert, and oriented. Vital signs  were stable. Hemoglobin and hematocrit were stable. Dressing was changed.  Wound was dry. Calves were soft. Lungs were  clear. Arrangements had been  made for transfer to [facility] on Monday, and on the  seventh postoperative day in improved condition with vital signs stable  being afebrile, dressing was changed, the wound was benign, calves were  negative, ecchymosis was slowly resolved. Her lungs were clear. Heart sounds  were normal. Bowel sounds were active. Her PT is 18.3 with INR 1.8. The  patient will be transferred to [facility] facility today.   CONDITION ON DISCHARGE:  Improved.   DISCHARGE MEDICATIONS:  1. Trinsicon one pill b.i.d.  2. Lasix 20 mg one p.o. q.d.  3. Levothyroxine 112 mcg one p.o. q.d.  4. Norvasc 5 mg one p.o. q.d.  5. Avapro 150 mg one p.o. q.d.  6. Robaxin 500 mg one p.o. q.8h. p.r.n. spasm.  7. Percocet 5/325 one p.o. q.4-6h. p.r.n. pain.  8. Laxative of choice.  9. Benadryl 25 mg one p.o. q.h.s. p.r.n. sleep.  10.      Coumadin per pharmacy protocol.   The patient will need to be on Coumadin until May 14 for routine DVT  prophylaxis with target INR 2 to 2.5. Dr. Mattie Marlin at [facility]  medical facility will be managing the Coumadin protocol. PT/INR should be  drawn twice weekly, the first one being tomorrow, Aug 21, 2003, and then  should be on either on Monday/Thursday or Tuesday/Friday schedule for the  following two weeks. Physical therapy should be undertaken on a daily basis  for range of motion, and she should be in a CPM unit for her knee eight  hours per day. She can ambulate with weight bearing as tolerated with  physical therapy. Ice packs to the knee on a p.r.n. basis. Appointment needs  to be made with our office two weeks from today for reevaluation of her in  the office. Office number [phone]. Also, the patient should use incentive  spirometry q.1h. while awake.     Sabrina Bishop. Chabon, P.A.                   Erasmo Leventhal, M.D.   SJC/MEDQ  D:  08/20/2003  T:  08/20/2003  Job:  [id number]

## 2010-09-05 NOTE — Op Note (Signed)
NAME:  Sabrina Bishop, Sabrina Bishop                     ACCOUNT NO.:  [id number]   MEDICAL RECORD NO.:  [id number]                   PATIENT TYPE:  INP   LOCATION:  0007                                 FACILITY:  [facility]   PHYSICIAN:  Erasmo Leventhal, M.D.         DATE OF BIRTH:  02/07/1917   DATE OF PROCEDURE:  08/14/2003  DATE OF DISCHARGE:                                 OPERATIVE REPORT   PREOPERATIVE DIAGNOSIS:  Left knee end-stage osteoarthritis.   POSTOPERATIVE DIAGNOSIS:  Left knee end-stage osteoarthritis.   OPERATION/PROCEDURE:  Left total knee arthroplasty.   SURGEON:  Erasmo Leventhal, M.D.   ASSISTANT:  Jaquelyn Bitter. Chabon, P.A.-C.   ANESTHESIA:  Spinal.   ESTIMATED BLOOD LOSS:  Less than 50 mL.   DRAINS:  Two Hemovacs.   COMPLICATIONS:  None.   TOURNIQUET TIME:  1 hour and 45 minutes at 350 mmHg.   COMPLICATIONS:  None.   DISPOSITION:  To PACU stable.   OPERATIVE IMPLANTS:  Osteonics components.  Posterior stabilizer.  All  cemented.  Size 5 femur, size 5 tibia, 12 mm Flex posterior stabilized  tibial insert, and a size 3, 8 mm thickness patella button.   DESCRIPTION OF PROCEDURE:  The patient is counseled in the holding area.  Correct side was identified.  Chart reviewed and signed appropriately.  IV  was started and antibiotics were given.   He was taken to the operating room and spinal anesthesia was administered.  Following this, she was placed in the supine position.  Foley catheter was  placed utilizing the sterile technique by the OR circulating nurse.  Left  knee was examined.  She had a 5-degree flexion contracture.  She flexed to  120 degrees.  She was elevated and prepped with DuraPrep and draped in the  sterile fashion.  Exsanguinated with Esmarch and tourniquet was increased to  350 mmHg.   This patient has a very thin skin.  Already had ecchymosis with just  activities of daily living.  We were very cautious with the skin throughout  the  entire case.  A longitudinal incision made through the skin and  subcutaneous tissue.  Small veins were electrocoagulated.  Skin flaps were  retracted posteriorly and developed at the appropriate level making sure the  skin was very well vascularized.  Medial parapatellar arthrotomy was  performed.  Medial soft tissue was released over the proximal medial tibia.  Patella was everted and the knee flexed.  End-stage osteoarthritic changes  with bone against bone contact.  Cruciate ligaments were resected.  Starter  hole was made in the distal femur.  At this time I noted immediately that  the bone was extremely soft.  We were very cautious with bone throughout the  entire case.  Intramedullary canal was irrigated and intramedullary rod was  placed.  Chose a 5-degree valgus cut.  We then took a 10 mm cut off the  distal femur.  This femur was found  to be a size 5.  Rotational marks were  made and the distal femur was cut to fit a size 5.  Medial and lateral  meniscal remnants were removed with electrocautery.  Medial and lateral  geniculates were coagulated.  Posterior neurovascular structures were  followed up and protected throughout the entire case.  The posterior  neurovascular structures were protected throughout the entire case.  Osteophytes were removed from the proximal tibia.  The tibial eminence was  resected.  Proximal tibia was found to be a size 5.  Starter hole made.  __________ was utilized.  A 10 mm cut was made on the lateral side and a 0-  degree slope.  After rotation, an intramedullary rod was then placed into  the canal.  Posteromedial and posterolateral femoral osteophytes were  meticulously removed under direct visualization.  Femoral trochlea was  prepared in the standard fashion.  At this point in time, with a size 5  femur, size 5 tibia, 10 mm Flex tibial insert, we had excellent range of  motion and soft tissue balance.  The rotational marks were made with the  keel  was performed in standard fashion.  I will also note while preparing  the intercondylar notch region, she had extremely soft osteophytic bone.  Some of this was removed for later bone graft in that region.   Patella was found to be a size 21 mm in thickness.  At this point in time,  we chose an 8 mm thick resurfacing patella, cut off 8 mm of the patella in a  parallel fashion, sized again, locking holes were made.  The patella was  remeasured and found to be at least 13-14 mm thick and satisfactory quality.   The knee was then irrigated with pulsatile lavage while the cement was  mixed.  Utilizing the Moder cement technique, all components were cemented  in place.  Prior to cementing the intercondylar notch region, small  cancellous autograft was taken from the tibial plateau resection level and  packed with direct pressure and held in place with some bone wax.  Utilizing  Moder cement technique, all components were cemented in place, size 5 femur,  size 5 tibia with a size 3 patella.  After the cement had cured, we did 10  and 12 mm thick trials and with 12 mm thick trial, we had excellent range of  motion and flexion and extension.  Flexion and extension were balance.  Patella femoral tracking was anatomic and did not require lateral release.  Tibial trial was removed.  Excess cement was removed.  Bone wax was placed  on exposed bony surfaces.  The tibial tray was thoroughly dried and we  tapped into place a 12 mm thick Flex posterior stabilized tibial insert.  Again range of motion was excellent.  The knee was completely stable.  All  wounds were copiously irrigated several times with antibiotic solution  during the closure.  The suprapatellar pouch fat was closed with Vicryl.   A sequential closure of layers was done with Vicryl, arthrotomy Vicryl,  subcutaneous tissue with Vicryl.  Distally it was noted that she had very poor skin quality.  The fat was basically not attached to the  dermis.  We  closed the fat as much as possible distally and then stapled the skin over  top of this.  This gave a nice closure without any undue pressure.  Also  prior to closure two Hemovac drains had been placed and intra-articular.  Sterile  compressive dressing applied to the knee.  The tourniquet was  deflated.  She had normal circulation in the foot and ankle at the end of  the case another 1 g of Ancef was given intravenously.  Ice pack and knee  immobilizer were applied with the knee in full extension.  The patient  tolerated the procedure well.  There were no complications.  She was taken  to the recovery room in stable condition.  Sponge and needle counts correct.                                               Erasmo Leventhal, M.D.    RAC/MEDQ  D:  08/14/2003  T:  08/14/2003  Job:  [id number]

## 2010-09-05 NOTE — H&P (Signed)
NAME:  Sabrina Bishop, Sabrina Bishop                     ACCOUNT NO.:  [id number]   MEDICAL RECORD NO.:  [id number]                   PATIENT TYPE:  INP   LOCATION:  NA                                   FACILITY:  [facility]   PHYSICIAN:  Erasmo Leventhal, M.D.         DATE OF BIRTH:  [birth year]   DATE OF ADMISSION:  08/14/2003  DATE OF DISCHARGE:                                HISTORY & PHYSICAL   CHIEF COMPLAINT:  Left knee osteoarthritis.   HISTORY OF PRESENT ILLNESS:  This is an 77 year old lady with a history of  bilateral knee osteoarthritis who has undergone previous total knee  arthroplasty of the right knee with good result and has continued pain in  her left knee that has been unresponsive to conservative measures.  She now  has pain both day and night, pain at rest, and requests total knee  arthroplasty.  The surgery, risks, benefits, and aftercare were discussed in  detail with the patient - questions are invited and answered.  She is seeing  Dr. Darryll Capers tomorrow and we will obtain medical clearance from him for  her surgery.  She does not have anybody that lives at home with her so  postoperatively she will require either rehabilitation admission or nursing  home assistance for her postoperative management.   PAST MEDICAL HISTORY:  1. Drug allergies:  None.  2. Current medications:     a. Lotrel 5/20 one once daily.     b. Lasix 20 mg once daily.     c. Levoxyl 112 mcg one once daily.     d. Darvocet one b.i.d.  3. Previous surgeries include hysterectomy, total knee arthroplasty right     knee.  4. Serious medical illnesses include hypertension, hypothyroidism, and skin     cancer.   FAMILY HISTORY:  Negative.   SOCIAL HISTORY:  Patient is widowed; she lives at home alone; she does not  smoke or drink.   REVIEW OF SYSTEMS:  CENTRAL NERVOUS SYSTEM:  Negative for headache, blurry  vision, or dizziness.  PULMONARY:  Positive for exertional shortness of  breath.   Negative for PND and orthopnea.  CARDIOVASCULAR:  Positive for  history of irregular heartbeats.  No chest pain or palpitations.  GI:  Negative for ulcer or hepatitis.  GU:  Negative for urinary tract  difficulty.  MUSCULOSKELETAL:  Positive in HPI.   PHYSICAL EXAMINATION:  VITAL SIGNS:  BP 170/70, respirations 12, pulse 78  and regular.  GENERAL APPEARANCE:  This is a well-developed, well-nourished lady in no  acute distress.  HEENT:  Head is normocephalic.  Nose patent.  Ears patent.  Pupils equal,  round and reactive to light.  Throat without injection.  NECK:  Supple without adenopathy, carotids 2+ without bruit.  CHEST:  Clear to auscultation.  No rales or rhonchi.  Respirations 12.  HEART:  Regular rate and rhythm at 78 beats per minute with a 1/6 systolic  ejection murmur.  ABDOMEN:  Soft with active bowel sounds.  No masses or organomegaly.  NEUROLOGIC:  Patient alert and oriented to time, place, and person.  Cranial  nerves II-XII grossly intact.  EXTREMITIES:  Right knee status post total knee replacement with 0-110  degree range of motion and good stability.  The left knee is swollen and  painful with range of motion 0-125 with pain.  Dorsalis pedis, posterior  tibialis pulses are 1+.   X-RAYS:  End-stage osteoarthritis with bone-on-bone medial compartment, bone-  on-bone patellofemoral compartment, and moderate lateral compartment  arthritis changes.   ASSESSMENT:  End-stage osteoarthritis left knee.   PLAN:  Total knee arthroplasty left knee.     Jaquelyn Bitter. Chabon, P.A.                   Erasmo Leventhal, M.D.    SJC/MEDQ  D:  08/08/2003  T:  08/08/2003  Job:  [id number]

## 2010-09-05 NOTE — Discharge Summary (Signed)
NAMESHANITRA, Sabrina Bishop                     ACCOUNT NO.:  [id number]   MEDICAL RECORD NO.:  [id number]                   PATIENT TYPE:  INP   LOCATION:  0464                                 FACILITY:  [facility]   PHYSICIAN:  Erasmo Leventhal, M.D.         DATE OF BIRTH:  01-01-1917   DATE OF ADMISSION:  08/14/2003  DATE OF DISCHARGE:                                 DISCHARGE SUMMARY   ADDENDUM:  Under medications, please remove Avapro 150 mg daily and in that  place, substitute Micardis 40 mg daily.     Sabrina Bishop, P.A.                   Erasmo Leventhal, M.D.    SJC/MEDQ  D:  08/20/2003  T:  08/20/2003  Job:  [id number]

## 2010-09-05 NOTE — Discharge Summary (Signed)
Sabrina Bishop, Sabrina Bishop                     ACCOUNT NO.:  [id number]   MEDICAL RECORD NO.:  [id number]                   PATIENT TYPE:  OIB   LOCATION:  6526                                 FACILITY:  [facility]   PHYSICIAN:  Doylene Canning. Ladona Ridgel, M.D.               DATE OF BIRTH:  14-Jan-1917   DATE OF ADMISSION:  11/19/2003  DATE OF DISCHARGE:  11/20/2003                                 DISCHARGE SUMMARY   DISCHARGE DIAGNOSIS:  Fatigue and bradycardia with symptomatic 2:1 AV block.   SECONDARY DIAGNOSES:  1. Status post left total-knee arthroplasty.  2. Hypothyroidism.  3. Left ventricular hypertrophy (mild).  4. Diastolic dysfunction.   PROCEDURE:  On November 19, 2003, status post implantation of DDD PTVDP  Medtronic Kappa KDR 901, serial number [id number].   DISCHARGE DISPOSITION:  Ms. Airabella Barley is ready for discharge post-  procedure day #1.  She has maintained sinus rhythm this hospitalization and  has had no respiratory compromise.  She has some V pacing with her sinus  rhythm.  She is requiring some analgesic help for pain in the shoulder blade  and also at the pacer insertion site.   DISCHARGE MEDICATIONS:  She goes home with the following medications:  1. Micardis 40 mg daily.  2. Levoxyl 112 mcg daily.  3. Lasix 20 mg daily.  4. Multivitamin daily.  5. Calcium two tabs daily.  6. For pain management, she may have either Tylenol 325 mg one to two tabs     every 4-6 hours as needed, or Percocet one tablet every 4 hours as needed     for pain.  She has been reassured that pain at the pacer site will     rapidly dissipate in one to two days.   DISCHARGE ACTIVITY:  The patient has been given an activity sheet describing  mobilization, driving and  showering in detail.  She is asked no to shower  until Monday, August 8.  She may sponge bathe until then.   DISCHARGE DIET:  A low-sodium, low-cholesterol diet.   FOLLOWUP:  1. She has a followup office visit at the pacer  clinic on Thursday, August     18, at 9:30 in the morning, [facility], [address].  2. She has an office visit with Dr. Ladona Ridgel on Wednesday, November 16, at     2005, at 11:10 in the morning.   BRIEF HISTORY:  The patient is an 77 year old female.  She has no history of  coronary artery disease.  She is referred for evaluation of new onset  symptomatic bradycardia.  The patient was found to have a slow heart rate on  examination during postoperative followup, after having undergone a knee  replacement in March of this year.  She was referred to Dr. Lovell Sheehan on July  21 because of this.  Electrocardiogram showed bradycardia at a rate of 50  beats per minute with 2:1  heart block.  The patient describes having felt  weak and short of breath over the past several months.  She denies any  history of chest discomfort.  She also does not experience orthopnea or  paroxysmal nocturnal dyspnea.  She does have chronic lower extremity edema  and has been on Norvasc for the past four months.  The Norvasc was  discontinued at office visit with [facility] on July 21.  The  patient had echocardiogram January of 2005, which showed normal left  ventricular function, mild concentric left ventricular hypertrophy, and some  evidence of diastolic dysfunction.  No significant valvular abnormalities  were noted at that time.   RECOMMENDATION:  Proceed with permanent pacemaker implantation to be  arranged for August 1.  If the patient exhibits persistent bradycardia, the  plan would be to implant the pacemaker at a sooner date.  The risks and  benefits of pacemaker implantation have been discussed fully with the  patient, who understands the risks and benefits.   HOSPITAL COURSE:  The patient was admitted November 19, 2003, and underwent  placement of a Medronic Kappa KDR 901 pacemaker, DDD pacer.  She has had  mild pain at the pacer site, but no hematoma, no ecchymosis.  The patient   understands the mobilization limitations and will present for follow as  described in our discharge here.      Maple Mirza, P.A.                    Doylene Canning. Ladona Ridgel, M.D.    GM/MEDQ  D:  11/20/2003  T:  11/20/2003  Job:  [id number]   cc:   Doylene Canning. Ladona Ridgel, M.D.   Stacie Glaze, M.D. [facility]

## 2010-09-05 NOTE — Discharge Summary (Signed)
Sabrina Bishop, Sabrina Bishop                     ACCOUNT NO.:  [id number]   MEDICAL RECORD NO.:  [id number]                   PATIENT TYPE:  INP   LOCATION:  0464                                 FACILITY:  [facility]   PHYSICIAN:  Erasmo Leventhal, M.D.         DATE OF BIRTH:  06-19-1916   DATE OF ADMISSION:  08/14/2003  DATE OF DISCHARGE:  08/20/2003                                 DISCHARGE SUMMARY   ADMISSION DIAGNOSIS:  Osteoarthritis, left knee.   DISCHARGE DIAGNOSIS:  Osteoarthritis, left knee.   OPERATION:  Total knee arthroplasty, left knee.   BRIEF HISTORY:  This is an 77 year old lady who appears younger than her  stated age with a history of bilateral knee osteoarthritis who has undergone  previous total knee arthroplasty of the right knee with good result and had  continued pain in her left knee despite conservative measures. She now has  pain both day and night, pain at rest, and requests total knee arthroplasty  on the left. The surgery risks, benefits and aftercare were discussed again  in detail with patient, and questions are invited and answered. The patient  was subsequently scheduled for total knee arthroplasty at that time.   The patient's medical doctor is Dr. Darryll Capers, and medical clearance was  obtained from him prior to her surgery, and she will be followed by their  medical service, [place] [facility] hospitalist, during admission.   LABORATORY DATA:  Admission CBC showed the WBC low at 3.0, hemoglobin low at  11.8, hematocrit low at 34.9. Admission CMET showed glucose high at 144,  otherwise normal. Admission PTT   Dictation ended at this point.     Jaquelyn Bitter. Chabon, P.A.                   Erasmo Leventhal, M.D.    SJC/MEDQ  D:  08/20/2003  T:  08/20/2003  Job:  [id number]

## 2010-09-05 NOTE — Op Note (Signed)
NAMEADELIS, DOCTER                     ACCOUNT NO.:  [id number]   MEDICAL RECORD NO.:  [id number]                   PATIENT TYPE:  OIB   LOCATION:  6526                                 FACILITY:  [facility]   PHYSICIAN:  Doylene Canning. Ladona Ridgel, M.D.               DATE OF BIRTH:  06/03/1916   DATE OF PROCEDURE:  11/19/2003  DATE OF DISCHARGE:                                 OPERATIVE REPORT   PROCEDURE PERFORMED:  Implantation of a dual-chamber pacemaker.   INDICATIONS:  Symptomatic bradycardia with 2:1 atrioventricular block.   INTRODUCTION:  The patient is a very pleasant 77 year old woman with a  history of bradycardia, who was referred by Stacie Glaze, M.D.  She has  had fatigue and weakness.  She has had exertional dyspnea.  She has been  found to have heart rates in the 40s with 2:1 AV block.  She is now referred  for permanent pacemaker insertion.   PROCEDURE:  After informed consent was obtained, the patient was taken to  the diagnostic EP lab in the fasting state.  After the usual preparation and  draping, intravenous fentanyl and midazolam were given for sedation.  Lidocaine 30 mL was infiltrated into the left infraclavicular region.  A 5  cm incision was carried out over this region and electrocautery utilized to  dissect down to the fascial plane.  Contrast 10 mL injected into the left  upper extremity venous system demonstrated a patent left subclavian vein.  It was subsequently punctured and the Medtronic model 5076 52-cm active-  fixation pacing lead, serial number [id number], was advanced into the right  ventricle.  Mapping was carried out in the final site.  The R-waves measured  12 mV and the pacing threshold was 0.4 V at 0.5 msec with a pacing impedance  of 777 Ohms.  Ten-volt pacing did not stimulate the diaphragm.  With the  ventricular lead in satisfactory position, attention was then turned to the  atrial lead.  Mapping was carried out and in the lateral  anterolateral wall  of the right atrium, the P-waves measured 3 mV and the atrial pacing  threshold was 0.9 V at 0.5 msec with a pacing impedance of 463 Ohms.  Again  10-volt pacing did not stimulate the diaphragm.  With both atrial and  ventricular leads in satisfactory position, they were secured to the  subpectoralis fascia with a figure-of-eight silk suture.  In addition, the  sewing sleeve was secured with silk suture.  Electrocautery was utilized to  make a subcutaneous pocket.  Kanamycin irrigation was utilized to irrigate  the pocket and electrocautery was utilized to assure hemostasis.  The  Medtronic Kappa 900, serial number [id number], was connected to the atrial  and ventricular pacing leads and placed in the subcutaneous pocket.  The  generator was secured with silk suture.  Additional kanamycin was utilized  to irrigate the pocket and the incision then closed with a  layer of 2-0  Vicryl, followed by a layer of 3-0 Vicryl, followed by a layer of 4-0  Vicryl.  Benzoin was painted on the skin and Steri-Strips were applied and a  pressure dressing placed, and the patient returned to her room in  satisfactory condition.   COMPLICATIONS:  There were no immediate procedural complications.   RESULTS:  This demonstrates successful implantation of a Medtronic dual-  chamber pacemaker in a patient with symptomatic 2:1 heart block.                                               Doylene Canning. Ladona Ridgel, M.D.    GWT/MEDQ  D:  11/19/2003  T:  11/19/2003  Job:  [id number]   cc:   Stacie Glaze, M.D. [facility]   Duke Salvia, M.D.

## 2010-09-25 ENCOUNTER — Encounter: Payer: Self-pay | Admitting: *Deleted

## 2010-11-14 ENCOUNTER — Encounter: Payer: Self-pay | Admitting: Internal Medicine

## 2010-12-04 ENCOUNTER — Other Ambulatory Visit: Payer: Self-pay | Admitting: Internal Medicine

## 2010-12-16 ENCOUNTER — Encounter: Payer: Self-pay | Admitting: Internal Medicine

## 2010-12-16 ENCOUNTER — Ambulatory Visit (INDEPENDENT_AMBULATORY_CARE_PROVIDER_SITE_OTHER): Payer: Medicare Other | Admitting: Internal Medicine

## 2010-12-16 DIAGNOSIS — I5032 Chronic diastolic (congestive) heart failure: Secondary | ICD-10-CM

## 2010-12-16 DIAGNOSIS — I509 Heart failure, unspecified: Secondary | ICD-10-CM

## 2010-12-16 DIAGNOSIS — Z950 Presence of cardiac pacemaker: Secondary | ICD-10-CM | POA: Insufficient documentation

## 2010-12-16 DIAGNOSIS — I10 Essential (primary) hypertension: Secondary | ICD-10-CM

## 2010-12-16 DIAGNOSIS — I442 Atrioventricular block, complete: Secondary | ICD-10-CM

## 2010-12-16 HISTORY — DX: Presence of cardiac pacemaker: Z95.0

## 2010-12-16 NOTE — Progress Notes (Signed)
  HPI  Sabrina Bishop is a 77 y.o. female seen in followup for complete heart block for which she underwent pacemaker implantation.    She has history of normal left ventricular function   She continues to complain of dyspnea on exertion. She also has chronic edema.  Her other major limitation however is her back and she is unable to stand but walks with a walker.  She thinks her dyspnea supersedes her back in terms of limitations although she thinks that this is the case.   Past Medical History  Diagnosis Date  . Asthma   . Hypothyroidism   . OA (osteoarthritis)   . HTN (hypertension)   . Diastolic dysfunction     Past Surgical History  Procedure Date  . Total knee arthroplasty     right    Current Outpatient Prescriptions  Medication Sig Dispense Refill  . atenolol (TENORMIN) 50 MG tablet TAKE 1 TABLET EVERY DAY  90 tablet  2  . Calcium Carbonate-Vitamin D (CALCIUM-VITAMIN D) 500-200 MG-UNIT per tablet Take 1 tablet by mouth daily.        . furosemide (LASIX) 40 MG tablet Take 40 mg by mouth daily.        Marland Kitchen HYDROcodone-acetaminophen (LORTAB) 7.5-500 MG per tablet Take 1 tablet by mouth 3 (three) times daily.        . isosorbide mononitrate (IMDUR) 30 MG 24 hr tablet Take 15 mg by mouth daily.        Marland Kitchen LEVOXYL 112 MCG tablet TAKE 1 TABLET EVERY DAY  90 tablet  2  . Multiple Vitamin (MULTIVITAMIN) capsule Take 1 capsule by mouth daily.        Marland Kitchen SPIRIVA HANDIHALER 18 MCG inhalation capsule INHALE 1 CONTENT OF CAPSULE EVERY DAY  30 each  7    No Known Allergies  Review of Systems negative except from HPI and PMH  Physical Exam Well developed and well nourished in no acute distress HENT normal E scleral and icterus clear Neck Supple Marked kyphosis JVP flat; carotids brisk and full Clear to ausculation Regular rate and rhythm, no murmurs gallops or rub Soft with active bowel sounds No clubbing cyanosis ;trace edema Alert and oriented, grossly normal motor and  sensory function Skin Warm and Dry; venous insufficiency changes     Assessment and  Plan

## 2010-12-16 NOTE — Assessment & Plan Note (Signed)
Well controlled 

## 2010-12-16 NOTE — Assessment & Plan Note (Signed)
The patient's device was interrogated.  The information was reviewed. No changes were made in the programming.    

## 2010-12-16 NOTE — Assessment & Plan Note (Signed)
stable °

## 2010-12-16 NOTE — Patient Instructions (Signed)
Your physician wants you to follow-up in: 1 year with Dr. Klein. You will receive a reminder letter in the mail two months in advance. If you don't receive a letter, please call our office to schedule the follow-up appointment.  Your physician recommends that you continue on your current medications as directed. Please refer to the Current Medication list given to you today.  

## 2010-12-16 NOTE — Assessment & Plan Note (Signed)
-  Continue diuretics. 

## 2011-01-19 DIAGNOSIS — M76899 Other specified enthesopathies of unspecified lower limb, excluding foot: Secondary | ICD-10-CM | POA: Insufficient documentation

## 2011-01-19 HISTORY — DX: Other specified enthesopathies of unspecified lower limb, excluding foot: M76.899

## 2011-02-19 ENCOUNTER — Other Ambulatory Visit: Payer: Self-pay | Admitting: Internal Medicine

## 2011-08-19 DIAGNOSIS — E782 Mixed hyperlipidemia: Secondary | ICD-10-CM

## 2011-08-19 DIAGNOSIS — R0902 Hypoxemia: Secondary | ICD-10-CM

## 2011-08-19 DIAGNOSIS — N182 Chronic kidney disease, stage 2 (mild): Secondary | ICD-10-CM

## 2011-08-19 HISTORY — DX: Mixed hyperlipidemia: E78.2

## 2011-08-19 HISTORY — DX: Chronic kidney disease, stage 2 (mild): N18.2

## 2011-08-19 HISTORY — DX: Hypoxemia: R09.02

## 2011-12-06 ENCOUNTER — Other Ambulatory Visit: Payer: Self-pay | Admitting: Internal Medicine

## 2011-12-08 ENCOUNTER — Encounter: Payer: Self-pay | Admitting: Internal Medicine

## 2011-12-08 ENCOUNTER — Encounter: Payer: Self-pay | Admitting: *Deleted

## 2011-12-08 ENCOUNTER — Ambulatory Visit (INDEPENDENT_AMBULATORY_CARE_PROVIDER_SITE_OTHER): Payer: Medicare Other | Admitting: Internal Medicine

## 2011-12-08 VITALS — BP 116/72 | HR 69 | Ht <= 58 in | Wt 138.0 lb

## 2011-12-08 DIAGNOSIS — I10 Essential (primary) hypertension: Secondary | ICD-10-CM

## 2011-12-08 DIAGNOSIS — R0609 Other forms of dyspnea: Secondary | ICD-10-CM

## 2011-12-08 DIAGNOSIS — I442 Atrioventricular block, complete: Secondary | ICD-10-CM

## 2011-12-08 DIAGNOSIS — Z950 Presence of cardiac pacemaker: Secondary | ICD-10-CM

## 2011-12-08 DIAGNOSIS — I5032 Chronic diastolic (congestive) heart failure: Secondary | ICD-10-CM

## 2011-12-08 LAB — PACEMAKER DEVICE OBSERVATION
AL AMPLITUDE: 2.8 mv
AL IMPEDENCE PM: 519 Ohm
BATTERY VOLTAGE: 2.65 V
RV LEAD IMPEDENCE PM: 480 Ohm
VENTRICULAR PACING PM: 100

## 2011-12-08 NOTE — Progress Notes (Signed)
  HPI  Sabrina Bishop is a 78 y.o. female seen in followup for complete heart block for which she underwent pacemaker implantation 2005   She is approaching ERI.  She has history of normal left ventricular function  She continues to complain of dyspnea on exertion. She also has chronic edema. A few years ago we undertook a trial of augmented diuresis but did not result in dyspnea relief but did change her weight.    Her other major limitation however is her back and she is unable to stand but walks with a walker. She thinks her dyspnea supersedes her back in terms of limitations although she thinks that this is the case.  Furthermore she thinks her dyspnea is worse now than it has been. She is limited to 10-25 ft . She has some chronic peripheral edema. She has been told that she has asthma and takes inhalers. Review of her x-ray studies demonstrated pulmonary nodules. On CT scanning ; her last chest x-ray was 2 years ago. She  is not inclined to think that this is a pulmonary process  Past Medical History  Diagnosis Date  . Asthma   . Hypothyroidism   . OA (osteoarthritis)   . HTN (hypertension)   . Diastolic dysfunction   . AV BLOCK, COMPLETE 03/26/2009    Qualifier: Diagnosis of  By: Graciela Husbands, MD, Ruthann Cancer Ty Hilts   . Pacemaker-mdt DDD 12/16/2010    Past Surgical History  Procedure Date  . Total knee arthroplasty     right    Current Outpatient Prescriptions  Medication Sig Dispense Refill  . atenolol (TENORMIN) 50 MG tablet TAKE 1 TABLET EVERY DAY  90 tablet  2  . Calcium Carbonate-Vitamin D (CALCIUM-VITAMIN D) 500-200 MG-UNIT per tablet Take 1 tablet by mouth daily.        . furosemide (LASIX) 40 MG tablet Take 40 mg by mouth daily.        Marland Kitchen HYDROcodone-acetaminophen (LORTAB) 7.5-500 MG per tablet Take 1 tablet by mouth 3 (three) times daily.        . isosorbide mononitrate (IMDUR) 30 MG 24 hr tablet Take 15 mg by mouth daily.        Marland Kitchen LEVOXYL 112 MCG tablet TAKE 1  TABLET EVERY DAY  90 tablet  2  . Multiple Vitamin (MULTIVITAMIN) capsule Take 1 capsule by mouth daily.        Marland Kitchen SPIRIVA HANDIHALER 18 MCG inhalation capsule INHALE 1 CONTENT OF CAPSULE EVERY DAY  30 each  7    No Known Allergies  Review of Systems negative except from HPI and PMH  Physical Exam BP 116/72  Pulse 69  Ht 4\' 8"  (1.422 m)  Wt 138 lb (62.596 kg)  BMI 30.94 kg/m2 Well developed and well nourished in no acute distress HENT normal E scleral and icterus clear Neck Supple JVP flat; carotids brisk and full Clear to ausculation *Regular rate and rhythm, no murmurs gallops or rub Soft with active bowel sounds No clubbing cyanosis Trace Edema Alert and oriented, grossly normal motor and sensory function Skin Warm and Dry  Electrocardiogram today demonstrates P. synchronous pacing  Assessment and  Plan

## 2011-12-08 NOTE — Assessment & Plan Note (Signed)
Currently well controlled.  

## 2011-12-08 NOTE — Assessment & Plan Note (Addendum)
Severe and progressive.  We have written a letter for her in supportive of a motorized wheelchair

## 2011-12-08 NOTE — Assessment & Plan Note (Signed)
I am not entirely convinced that this is the problem. Statistically though it is quite likely. Her lack of either small improvement with her diuretics however makes me less optimistic that this is the case. Her heart rate excursion is not broad. I don't think there is any value in further slowing her heart rate. She apparently has some kidney issues and she will be seeing Dr. Chilton Si next week and I will defer to him whether we can try to up titrate her diuretics.

## 2011-12-08 NOTE — Patient Instructions (Addendum)
Remote monitoring is used to monitor your Pacemaker of ICD from home. This monitoring reduces the number of office visits required to check your device to one time per year. It allows [place] to keep an eye on the functioning of your device to ensure it is working properly. You are scheduled for a device check from home on January 11, 2012. You may send your transmission at any time that day. If you have a wireless device, the transmission will be sent automatically. After your physician reviews your transmission, you will receive a postcard with your next transmission date.  Your physician wants you to follow-up in: 1 year with Dr. Graciela Husbands. You will receive a reminder letter in the mail two months in advance. If you don't receive a letter, please call our office to schedule the follow-up appointment.  Your physician recommends that you continue on your current medications as directed. Please refer to the Current Medication list given to you today.

## 2011-12-08 NOTE — Assessment & Plan Note (Signed)
The patient's device was interrogated.  The information was reviewed. No changes were made in the programming.    

## 2011-12-16 NOTE — Addendum Note (Signed)
Addended by: Linzie Collin D on: 12/16/2011 02:17 PM   Modules accepted: Orders

## 2012-01-11 ENCOUNTER — Ambulatory Visit (INDEPENDENT_AMBULATORY_CARE_PROVIDER_SITE_OTHER): Payer: Medicare Other | Admitting: *Deleted

## 2012-01-11 DIAGNOSIS — I442 Atrioventricular block, complete: Secondary | ICD-10-CM

## 2012-01-18 LAB — REMOTE PACEMAKER DEVICE
ATRIAL PACING PM: 8
BRDY-0002RV: 50 {beats}/min
BRDY-0003RV: 130 {beats}/min
VENTRICULAR PACING PM: 100

## 2012-01-19 NOTE — Progress Notes (Signed)
Remote pacer check  

## 2012-02-02 ENCOUNTER — Encounter: Payer: Self-pay | Admitting: *Deleted

## 2012-02-04 ENCOUNTER — Other Ambulatory Visit: Payer: Self-pay | Admitting: Internal Medicine

## 2012-02-12 ENCOUNTER — Encounter: Payer: Self-pay | Admitting: Internal Medicine

## 2012-02-15 ENCOUNTER — Ambulatory Visit (INDEPENDENT_AMBULATORY_CARE_PROVIDER_SITE_OTHER): Payer: Medicare Other | Admitting: *Deleted

## 2012-02-15 ENCOUNTER — Encounter: Payer: Self-pay | Admitting: Internal Medicine

## 2012-02-15 DIAGNOSIS — I442 Atrioventricular block, complete: Secondary | ICD-10-CM

## 2012-02-16 DIAGNOSIS — I5030 Unspecified diastolic (congestive) heart failure: Secondary | ICD-10-CM | POA: Insufficient documentation

## 2012-02-16 HISTORY — DX: Unspecified diastolic (congestive) heart failure: I50.30

## 2012-02-22 LAB — REMOTE PACEMAKER DEVICE
BATTERY VOLTAGE: 2.59 V
BRDY-0002RV: 50 {beats}/min
BRDY-0003RV: 130 {beats}/min
BRDY-0004RV: 130 {beats}/min
RV LEAD THRESHOLD: 0.75 V
VENTRICULAR PACING PM: 100

## 2012-03-20 DIAGNOSIS — L2089 Other atopic dermatitis: Secondary | ICD-10-CM

## 2012-03-20 HISTORY — DX: Other atopic dermatitis: L20.89

## 2012-03-21 ENCOUNTER — Encounter: Payer: Self-pay | Admitting: Internal Medicine

## 2012-03-21 ENCOUNTER — Ambulatory Visit (INDEPENDENT_AMBULATORY_CARE_PROVIDER_SITE_OTHER): Payer: Medicare Other | Admitting: *Deleted

## 2012-03-21 DIAGNOSIS — I442 Atrioventricular block, complete: Secondary | ICD-10-CM

## 2012-03-22 ENCOUNTER — Encounter: Payer: Self-pay | Admitting: *Deleted

## 2012-03-28 LAB — REMOTE PACEMAKER DEVICE
ATRIAL PACING PM: 8
BRDY-0002RV: 50 {beats}/min
BRDY-0004RV: 130 {beats}/min
VENTRICULAR PACING PM: 100

## 2012-04-05 ENCOUNTER — Encounter: Payer: Self-pay | Admitting: *Deleted

## 2012-04-20 HISTORY — PX: PACEMAKER GENERATOR CHANGE: SHX5998

## 2012-04-22 ENCOUNTER — Encounter: Payer: Self-pay | Admitting: Internal Medicine

## 2012-04-25 ENCOUNTER — Ambulatory Visit (INDEPENDENT_AMBULATORY_CARE_PROVIDER_SITE_OTHER): Payer: Medicare Other | Admitting: *Deleted

## 2012-04-25 DIAGNOSIS — Z950 Presence of cardiac pacemaker: Secondary | ICD-10-CM

## 2012-04-25 LAB — REMOTE PACEMAKER DEVICE
BATTERY VOLTAGE: 2.6 V
BMOD-0005RV: 95 {beats}/min
RV LEAD IMPEDENCE PM: 462 Ohm
VENTRICULAR PACING PM: 100

## 2012-04-29 ENCOUNTER — Encounter: Payer: Self-pay | Admitting: Internal Medicine

## 2012-04-29 ENCOUNTER — Encounter: Payer: Self-pay | Admitting: *Deleted

## 2012-04-29 ENCOUNTER — Encounter: Payer: Medicare Other | Admitting: Internal Medicine

## 2012-04-29 ENCOUNTER — Ambulatory Visit (INDEPENDENT_AMBULATORY_CARE_PROVIDER_SITE_OTHER): Payer: Medicare Other | Admitting: Internal Medicine

## 2012-04-29 VITALS — BP 140/72 | Ht <= 58 in | Wt 139.0 lb

## 2012-04-29 DIAGNOSIS — R0609 Other forms of dyspnea: Secondary | ICD-10-CM

## 2012-04-29 DIAGNOSIS — Z950 Presence of cardiac pacemaker: Secondary | ICD-10-CM

## 2012-04-29 DIAGNOSIS — I442 Atrioventricular block, complete: Secondary | ICD-10-CM

## 2012-04-29 DIAGNOSIS — D66 Hereditary factor VIII deficiency: Secondary | ICD-10-CM

## 2012-04-29 LAB — CBC WITH DIFFERENTIAL/PLATELET
Basophils Relative: 0.2 % (ref 0.0–3.0)
Eosinophils Relative: 0 % (ref 0.0–5.0)
HCT: 35.3 % — ABNORMAL LOW (ref 36.0–46.0)
Hemoglobin: 11.8 g/dL — ABNORMAL LOW (ref 12.0–15.0)
MCV: 96.6 fl (ref 78.0–100.0)
Monocytes Absolute: 0.8 10*3/uL (ref 0.1–1.0)
Neutro Abs: 3.6 10*3/uL (ref 1.4–7.7)
Neutrophils Relative %: 68 % (ref 43.0–77.0)
RBC: 3.66 Mil/uL — ABNORMAL LOW (ref 3.87–5.11)
WBC: 5.3 10*3/uL (ref 4.5–10.5)

## 2012-04-29 LAB — PROTIME-INR: INR: 1 ratio (ref 0.8–1.0)

## 2012-04-29 LAB — BASIC METABOLIC PANEL
CO2: 32 mEq/L (ref 19–32)
Chloride: 97 mEq/L (ref 96–112)
Creatinine, Ser: 1.6 mg/dL — ABNORMAL HIGH (ref 0.4–1.2)
Potassium: 3.6 mEq/L (ref 3.5–5.1)
Sodium: 138 mEq/L (ref 135–145)

## 2012-04-29 NOTE — Progress Notes (Signed)
Patient Care Team: Arthur G Green, MD (Internal Medicine)   HPI  Sabrina Bishop is a 79 y.o. female Seen in followup for complete heart block for which she underwent pacemaker implantation 2005. She has a history of normal left ventricular function. She comes in with complaints of worsening shortness of breath and is noted to be at ERI.  Laboratories are reviewed. Her TSH is borderline normal. Her BNP in October was a little bit elevated. On Aldactone her potassium level is under 4  Past Medical History  Diagnosis Date  . Asthma   . Hypothyroidism   . OA (osteoarthritis)   . HTN (hypertension)   . Diastolic dysfunction   . AV BLOCK, COMPLETE 03/26/2009    Qualifier: Diagnosis of  By: Klein, MD, FACC, Steven Cochran   . Pacemaker-mdt DDD 12/16/2010    Past Surgical History  Procedure Date  . Total knee arthroplasty     right    Current Outpatient Prescriptions  Medication Sig Dispense Refill  . atenolol (TENORMIN) 50 MG tablet TAKE 1 TABLET EVERY DAY  90 tablet  2  . atorvastatin (LIPITOR) 10 MG tablet       . Calcium Carbonate-Vitamin D (CALCIUM-VITAMIN D) 500-200 MG-UNIT per tablet Take 1 tablet by mouth daily.        . furosemide (LASIX) 40 MG tablet Take 40 mg by mouth daily. 1 1/2 tablet daily      . HYDROcodone-acetaminophen (LORTAB) 7.5-500 MG per tablet Take 1 tablet by mouth 3 (three) times daily.        . isosorbide mononitrate (IMDUR) 30 MG 24 hr tablet Take 15 mg by mouth daily.        . LEVOXYL 112 MCG tablet TAKE 1 TABLET EVERY DAY  90 tablet  2  . losartan (COZAAR) 50 MG tablet       . Multiple Vitamin (MULTIVITAMIN) capsule Take 1 capsule by mouth daily.        . spironolactone (ALDACTONE) 25 MG tablet Take 25 mg by mouth daily.      . triamcinolone cream (KENALOG) 0.1 %         No Known Allergies  Review of Systems negative except from HPI and PMH  Physical Exam BP 140/72  Ht 4' 8" (1.422 m)  Wt 139 lb (63.05 kg)  BMI 31.16 kg/m2 Well developed  and nourished in no acute distress HENT normal Neck supple with JVP-flat Clear Device pocket well healed; without hematoma or erythema Regular rate and rhythm, no murmurs or gallops Abd-soft with active BS No Clubbing cyanosis edema Skin-warm and dry A & Oriented  Grossly normal sensory and motor function  ECG today demonstrates ventricular pacing at 65 with isorhythmic AV association   Assessment and  Plan Sabrina Bishop ERI also 

## 2012-04-29 NOTE — Assessment & Plan Note (Signed)
As above.

## 2012-04-29 NOTE — Assessment & Plan Note (Signed)
Patient's device has reached ERI. We have discussed generator replacement including the risk of infection. She understands and is willing to proceed

## 2012-04-29 NOTE — Patient Instructions (Signed)
Your physician has recommended that you have a pacemaker generator change. Please see the instruction sheet given to you today for more information.    

## 2012-04-29 NOTE — Assessment & Plan Note (Signed)
She has a history of chronic dyspnea. This may have worsened recently.  this might have been aggravated by the reversion of her device to the VVI mode.

## 2012-05-01 MED ORDER — SODIUM CHLORIDE 0.9 % IR SOLN
80.0000 mg | Status: AC
  Filled 2012-05-01: qty 2

## 2012-05-01 MED ORDER — CEFAZOLIN SODIUM-DEXTROSE 2-3 GM-% IV SOLR
2.0000 g | INTRAVENOUS | Status: AC
  Filled 2012-05-01: qty 50

## 2012-05-02 ENCOUNTER — Ambulatory Visit (HOSPITAL_COMMUNITY)
Admission: RE | Admit: 2012-05-02 | Discharge: 2012-05-02 | Disposition: A | Discharge status: Home / Self Care | Payer: Medicare Other | Source: Ambulatory Visit | Attending: Internal Medicine | Admitting: Internal Medicine

## 2012-05-02 ENCOUNTER — Encounter (HOSPITAL_COMMUNITY)
Admission: RE | Disposition: A | Discharge status: Home / Self Care | Payer: Self-pay | Source: Ambulatory Visit | Attending: Internal Medicine

## 2012-05-02 DIAGNOSIS — Z7902 Long term (current) use of antithrombotics/antiplatelets: Secondary | ICD-10-CM | POA: Insufficient documentation

## 2012-05-02 DIAGNOSIS — Z79899 Other long term (current) drug therapy: Secondary | ICD-10-CM | POA: Insufficient documentation

## 2012-05-02 DIAGNOSIS — E039 Hypothyroidism, unspecified: Secondary | ICD-10-CM | POA: Insufficient documentation

## 2012-05-02 DIAGNOSIS — I10 Essential (primary) hypertension: Secondary | ICD-10-CM | POA: Insufficient documentation

## 2012-05-02 DIAGNOSIS — J45909 Unspecified asthma, uncomplicated: Secondary | ICD-10-CM | POA: Insufficient documentation

## 2012-05-02 DIAGNOSIS — M1990 Unspecified osteoarthritis, unspecified site: Secondary | ICD-10-CM | POA: Insufficient documentation

## 2012-05-02 DIAGNOSIS — I442 Atrioventricular block, complete: Secondary | ICD-10-CM

## 2012-05-02 DIAGNOSIS — Z45018 Encounter for adjustment and management of other part of cardiac pacemaker: Secondary | ICD-10-CM | POA: Insufficient documentation

## 2012-05-02 HISTORY — PX: PERMANENT PACEMAKER GENERATOR CHANGE: SHX6022

## 2012-05-02 SURGERY — PERMANENT PACEMAKER GENERATOR CHANGE
Anesthesia: LOCAL

## 2012-05-02 MED ORDER — MUPIROCIN 2 % EX OINT
TOPICAL_OINTMENT | Freq: Two times a day (BID) | CUTANEOUS | Status: DC
  Administered 2012-05-02: 1 via NASAL

## 2012-05-02 MED ORDER — LIDOCAINE HCL (PF) 1 % IJ SOLN
INTRAMUSCULAR | Status: AC
  Filled 2012-05-02: qty 60

## 2012-05-02 MED ORDER — SODIUM CHLORIDE 0.45 % IV SOLN
INTRAVENOUS | Status: DC
  Administered 2012-05-02: 50 mL/h via INTRAVENOUS

## 2012-05-02 MED ORDER — CEFAZOLIN SODIUM-DEXTROSE 2-3 GM-% IV SOLR
INTRAVENOUS | Status: AC
  Filled 2012-05-02: qty 50

## 2012-05-02 MED ORDER — CHLORHEXIDINE GLUCONATE 4 % EX LIQD: 60.0000 mL | Freq: Once | CUTANEOUS | Status: DC

## 2012-05-02 MED ORDER — MUPIROCIN 2 % EX OINT
TOPICAL_OINTMENT | CUTANEOUS | Status: AC
  Filled 2012-05-02: qty 22

## 2012-05-02 MED ORDER — ONDANSETRON HCL 4 MG/2ML IJ SOLN: 4.0000 mg | Freq: Four times a day (QID) | INTRAMUSCULAR | Status: DC | PRN

## 2012-05-02 MED ORDER — CEFAZOLIN SODIUM 1-5 GM-% IV SOLN
INTRAVENOUS | Status: AC
  Filled 2012-05-02: qty 100

## 2012-05-02 MED ORDER — SODIUM CHLORIDE 0.9 % IV SOLN: INTRAVENOUS | Status: DC

## 2012-05-02 MED ORDER — ACETAMINOPHEN 325 MG PO TABS: 325.0000 mg | ORAL_TABLET | ORAL | Status: DC | PRN

## 2012-05-02 NOTE — Progress Notes (Signed)
PER AMBER,RN CONT MUPIROCIN FOR TOTAL OF 10 DOSES

## 2012-05-02 NOTE — CV Procedure (Signed)
Preoperative diagnosis CHB and sinus node dysfunction; device at [facility] Postoperative diagnosis same/   Procedure: Generator replacement     Following informed consent the patient was brought to the electrophysiology laboratory in place of the fluoroscopic table in the supine position after routine prep and drape lidocaine was infiltrated in the region of the previous incision and carried down to later the device pocket using sharp dissection and electrocautery. The pocket was opened the device was freed up and was explanted.  Interrogation of the previously implanted ventricular lead Medtronic   demonstrated an R wave of N/A  millivolts., and impedance of 463ohms, and a pacing threshold of 0.7 volts at o.5 msec.    The previously implanted atrial lead Medtronic  added P-wave amplitude of 2.2 illlivolts  and impedance of  491ohms, and a pacing threshold of 0.7volts at 0.5 milliseconds.  The leads were inspected. The leads were then attached to a Medtronic Sensia pulse generator, serial number [id number].    The pocket was irrigated with antibiotic containing saline solution hemostasis was assured and the leads and the device were placed in the pocket. The wound is then closed in 2 layers in normal fashion.  The patient tolerated the procedure without apparent complication.  Sherryl Manges

## 2012-05-02 NOTE — Progress Notes (Signed)
PER CATH LAB STAFF NO CXR NEEDED PER TIFFANY IN CATH LAB

## 2012-05-02 NOTE — H&P (View-Only) (Signed)
Patient Care Team: Kimber Relic, MD (Internal Medicine)   HPI  Sabrina Bishop is a 79 y.o. female Seen in followup for complete heart block for which she underwent pacemaker implantation 2005. She has a history of normal left ventricular function. She comes in with complaints of worsening shortness of breath and is noted to be at [facility].  Laboratories are reviewed. Her TSH is borderline normal. Her BNP in October was a little bit elevated. On Aldactone her potassium level is under 4  Past Medical History  Diagnosis Date  . Asthma   . Hypothyroidism   . OA (osteoarthritis)   . HTN (hypertension)   . Diastolic dysfunction   . AV BLOCK, COMPLETE 03/26/2009    Qualifier: Diagnosis of  By: Graciela Husbands, MD, Ruthann Cancer Ty Hilts   . Pacemaker-mdt DDD 12/16/2010    Past Surgical History  Procedure Date  . Total knee arthroplasty     right    Current Outpatient Prescriptions  Medication Sig Dispense Refill  . atenolol (TENORMIN) 50 MG tablet TAKE 1 TABLET EVERY DAY  90 tablet  2  . atorvastatin (LIPITOR) 10 MG tablet       . Calcium Carbonate-Vitamin D (CALCIUM-VITAMIN D) 500-200 MG-UNIT per tablet Take 1 tablet by mouth daily.        . furosemide (LASIX) 40 MG tablet Take 40 mg by mouth daily. 1 1/2 tablet daily      . HYDROcodone-acetaminophen (LORTAB) 7.5-500 MG per tablet Take 1 tablet by mouth 3 (three) times daily.        . isosorbide mononitrate (IMDUR) 30 MG 24 hr tablet Take 15 mg by mouth daily.        Marland Kitchen LEVOXYL 112 MCG tablet TAKE 1 TABLET EVERY DAY  90 tablet  2  . losartan (COZAAR) 50 MG tablet       . Multiple Vitamin (MULTIVITAMIN) capsule Take 1 capsule by mouth daily.        Marland Kitchen spironolactone (ALDACTONE) 25 MG tablet Take 25 mg by mouth daily.      Marland Kitchen triamcinolone cream (KENALOG) 0.1 %         No Known Allergies  Review of Systems negative except from HPI and PMH  Physical Exam BP 140/72  Ht 4\' 8"  (1.422 m)  Wt 139 lb (63.05 kg)  BMI 31.16 kg/m2 Well developed  and nourished in no acute distress HENT normal Neck supple with JVP-flat Clear Device pocket well healed; without hematoma or erythema Regular rate and rhythm, no murmurs or gallops Abd-soft with active BS No Clubbing cyanosis edema Skin-warm and dry A & Oriented  Grossly normal sensory and motor function  ECG today demonstrates ventricular pacing at 65 with isorhythmic AV association   Assessment and  Plan Sabrina Bishop ERI also

## 2012-05-02 NOTE — Progress Notes (Signed)
CLIENT NOTIFIED TO CONT MUPIROCIN AND STATES HAS RASH NOW UNDER BREASTS AND BEHIND KNEES AND ADVISED TO CALL DR OFFICE

## 2012-05-02 NOTE — Interval H&P Note (Signed)
History and Physical Interval Note:  05/02/2012 12:00 PM  Sabrina Bishop  has presented today for surgery, with the diagnosis of End of life  The various methods of treatment have been discussed with the patient and family. After consideration of risks, benefits and other options for treatment, the patient has consented to  Procedure(s) (LRB) with comments: PERMANENT PACEMAKER GENERATOR CHANGE (N/A) as a surgical intervention .  The patient's history has been reviewed, patient examined, no change in status, stable for surgery.  I have reviewed the patient's chart and labs.  Questions were answered to the patient's satisfaction.     Sherryl Manges

## 2012-05-10 ENCOUNTER — Encounter: Payer: Medicare Other | Admitting: Internal Medicine

## 2012-05-11 ENCOUNTER — Telehealth: Payer: Self-pay | Admitting: Internal Medicine

## 2012-05-11 ENCOUNTER — Ambulatory Visit: Payer: Medicare Other

## 2012-05-11 NOTE — Telephone Encounter (Signed)
New problem:   Has question regarding dressing change

## 2012-05-11 NOTE — Telephone Encounter (Signed)
Spoke with patient and explained that we will remove steri strips at her wound check appointment 05/12/12.

## 2012-05-12 ENCOUNTER — Ambulatory Visit (INDEPENDENT_AMBULATORY_CARE_PROVIDER_SITE_OTHER): Payer: Medicare Other | Admitting: *Deleted

## 2012-05-12 ENCOUNTER — Encounter: Payer: Self-pay | Admitting: Internal Medicine

## 2012-05-12 DIAGNOSIS — I442 Atrioventricular block, complete: Secondary | ICD-10-CM

## 2012-05-12 LAB — PACEMAKER DEVICE OBSERVATION
AL AMPLITUDE: 2.8 mv
AL IMPEDENCE PM: 475 Ohm
BATTERY VOLTAGE: 2.79 V
RV LEAD IMPEDENCE PM: 465 Ohm
VENTRICULAR PACING PM: 100

## 2012-05-12 NOTE — Progress Notes (Signed)
Wound check-PPM 

## 2012-05-12 NOTE — Patient Instructions (Addendum)
Send a [facility] transmission 08/08/12.

## 2012-05-20 ENCOUNTER — Encounter: Payer: Self-pay | Admitting: Internal Medicine

## 2012-05-21 DIAGNOSIS — L821 Other seborrheic keratosis: Secondary | ICD-10-CM

## 2012-05-21 HISTORY — DX: Other seborrheic keratosis: L82.1

## 2012-06-28 ENCOUNTER — Telehealth: Payer: Self-pay | Admitting: Internal Medicine

## 2012-06-28 NOTE — Telephone Encounter (Signed)
Per pt call she is having pain at her pacer site when she turns over and it red and a tiny  little blood spot and she sent a transmission and she wanted to talk to someone today she will be leaving at 1pm

## 2012-06-28 NOTE — Telephone Encounter (Signed)
Left message for patient to call and schedule a wound check with the device clinic.

## 2012-07-26 ENCOUNTER — Other Ambulatory Visit: Payer: Self-pay | Admitting: Internal Medicine

## 2012-07-28 ENCOUNTER — Ambulatory Visit (INDEPENDENT_AMBULATORY_CARE_PROVIDER_SITE_OTHER): Payer: Medicare Other | Admitting: Internal Medicine

## 2012-07-28 ENCOUNTER — Encounter: Payer: Self-pay | Admitting: Geriatric Medicine

## 2012-07-28 ENCOUNTER — Encounter: Payer: Self-pay | Admitting: Internal Medicine

## 2012-07-28 VITALS — BP 142/84 | HR 78 | Temp 97.4°F | Resp 15 | Ht <= 58 in | Wt 142.8 lb

## 2012-07-28 DIAGNOSIS — S61509A Unspecified open wound of unspecified wrist, initial encounter: Secondary | ICD-10-CM

## 2012-07-28 DIAGNOSIS — IMO0002 Reserved for concepts with insufficient information to code with codable children: Secondary | ICD-10-CM

## 2012-07-28 DIAGNOSIS — L03114 Cellulitis of left upper limb: Secondary | ICD-10-CM

## 2012-07-28 DIAGNOSIS — S61512A Laceration without foreign body of left wrist, initial encounter: Secondary | ICD-10-CM

## 2012-07-28 MED ORDER — CEPHALEXIN 250 MG PO CAPS: 250.0000 mg | ORAL_CAPSULE | Freq: Four times a day (QID) | ORAL | Status: AC

## 2012-07-28 NOTE — Progress Notes (Signed)
Patient ID: Sabrina Bishop, female   DOB: 1916-08-31, 79 y.o.   MRN: [id number]   No Known Allergies  Chief Complaint  Patient presents with  . skin tear on left arm, warm to tough and fever    HPI: Patient is a 79 y.o. Caucasian female seen in the office today for skin tear on left arm.  Had low grade fever 99 this morning she says.  Got real red today.  Skin tear happened Monday.  Nurse has been treating daily since (4x).  Not terribly painful.  Takes pain pill for back anyway, she says.  No chills.    Review of Systems:  Review of Systems  Constitutional: Positive for fever and chills. Negative for malaise/fatigue.  Respiratory: Negative for shortness of breath.   Cardiovascular: Negative for chest pain.  Gastrointestinal: Negative for abdominal pain.  Genitourinary: Negative for dysuria.  Musculoskeletal: Negative for falls.  Skin:       Left arm skin tear with erythema, warmth, no drainage  Neurological: Negative for dizziness and headaches.  Psychiatric/Behavioral: Negative for depression.    Past Medical History  Diagnosis Date  . Asthma   . Hypothyroidism   . OA (osteoarthritis)   . HTN (hypertension)   . Diastolic dysfunction   . AV BLOCK, COMPLETE 03/26/2009    Qualifier: Diagnosis of  By: Graciela Husbands, MD, Ruthann Cancer Ty Hilts   . Pacemaker-mdt DDD 12/16/2010  . Keratoconus, unspecified   . Unspecified venous (peripheral) insufficiency   . COPD (chronic obstructive pulmonary disease)   . Disturbance of salivary secretion   . GERD (gastroesophageal reflux disease)   . Osteoarthrosis, unspecified whether generalized or localized, unspecified site   . Pain in joint, ankle and foot   . Spinal stenosis, lumbar region, without neurogenic claudication   . Lumbago   . Trigger finger (acquired)   . Abnormality of gait   . Edema   . Shortness of breath   . Dysphagia, unspecified   . Cardiac pacemaker in situ    Past Surgical History  Procedure Laterality Date  . Total  knee arthroplasty      right  . Abdominal hysterectomy  1950   Social History:   reports that she has never smoked. She does not have any smokeless tobacco history on file. Her alcohol and drug histories are not on file.  Family History  Problem Relation Age of Onset  . Hyperlipidemia    . Hypertension    . Osteoporosis    . Stroke Father   . Congestive Heart Failure Brother   . Congestive Heart Failure Brother     Medications: Patient's Medications  New Prescriptions   No medications on file  Previous Medications   ATENOLOL (TENORMIN) 50 MG TABLET    Take 50 mg by mouth daily.   ATORVASTATIN (LIPITOR) 10 MG TABLET    Take 10 mg by mouth daily.    CALCIUM CARBONATE-VITAMIN D (CALCIUM-VITAMIN D) 500-200 MG-UNIT PER TABLET    Take 1 tablet by mouth daily.     FUROSEMIDE (LASIX) 40 MG TABLET    Take 80 mg by mouth daily. 2  Po daily   HYDROCODONE-ACETAMINOPHEN (NORCO) 7.5-325 MG PER TABLET    TAKE 1 TABLET EVERY 4 HOURS AS NEEDED FOR PAIN   ISOSORBIDE MONONITRATE (IMDUR) 30 MG 24 HR TABLET    Take 15 mg by mouth daily.     LEVOTHYROXINE (LEVOXYL) 112 MCG TABLET    Take 112 mcg by mouth daily.   LOSARTAN (COZAAR)  50 MG TABLET    Take 50 mg by mouth daily.    MULTIPLE VITAMIN (MULTIVITAMIN) CAPSULE    Take 1 capsule by mouth daily.     SPIRONOLACTONE (ALDACTONE) 25 MG TABLET    Take 25 mg by mouth daily.   TRIAMCINOLONE CREAM (KENALOG) 0.1 %    Apply 1 application topically 3 (three) times daily.   Modified Medications   No medications on file  Discontinued Medications   HYDROCODONE-ACETAMINOPHEN (LORTAB) 7.5-500 MG PER TABLET    Take 1 tablet by mouth 3 (three) times daily.       Physical Exam: Filed Vitals:   07/28/12 1520  BP: 142/84  Pulse: 78  Temp: 97.4 F (36.3 C)  TempSrc: Oral  Resp: 15  Height: 4\' 8"  (1.422 m)  Weight: 142 lb 12.8 oz (64.774 kg)   Physical Exam  Constitutional: She appears well-developed and well-nourished.  Caucasian female, NAD   Cardiovascular: Normal rate and regular rhythm.   Pulmonary/Chest: Effort normal and breath sounds normal.  Skin:  Left forearm with 3 cm skin tear with edges poorly approximated, one steristrip in place, no drainage, has surrounding erythema and warmth of wrist and forearm  Psychiatric: She has a normal mood and affect. Her behavior is normal.      Labs reviewed: Basic Metabolic Panel:  Recent Labs  40/98/11 1225  NA 138  K 3.6  CL 97  CO2 32  GLUCOSE 117*  BUN 31*  CREATININE 1.6*  CALCIUM 9.9   CBC:  Recent Labs  04/29/12 1225  WBC 5.3  NEUTROABS 3.6  HGB 11.8*  HCT 35.3*  MCV 96.6  PLT 145.0*    Assessment/Plan Left wrist skin tear:  Source for infection, was cleaned and dried, antibiotic ointment and steristrips applied to better approximate edge, wrapped with kerlix  Left forearm cellulitis:  Begin keflex 250mg  po qid for a week, and eat yogurt (did not want to take florastor) to prevent c diff and candida vaginitis.    Labs/tests ordered:  Cbc  Keep f/u with Dr. Chilton Si.

## 2012-07-28 NOTE — Patient Instructions (Signed)
Continue antibiotic ointment and nonadhesive dressing to skin tear. I started you on keflex 4 times daily for a week for a skin infection. Please eat one yogurt a day to help keep the good bacteria in your bowels and to prevent a yeast infection. We checked your wbc count to assess the severity of the skin infection. If the redness and warmth are not improving by Thursday, please arrange to be seen again.

## 2012-07-29 LAB — CBC WITH DIFFERENTIAL/PLATELET
Basophils Absolute: 0 10*3/uL (ref 0.0–0.2)
Basos: 0 % (ref 0–3)
Eos: 0 % (ref 0–5)
Eosinophils Absolute: 0 10*3/uL (ref 0.0–0.4)
HCT: 33.6 % — ABNORMAL LOW (ref 34.0–46.6)
Hemoglobin: 10.8 g/dL — ABNORMAL LOW (ref 11.1–15.9)
Immature Grans (Abs): 0 10*3/uL (ref 0.0–0.1)
Immature Granulocytes: 0 % (ref 0–2)
Lymphocytes Absolute: 1.1 10*3/uL (ref 0.7–3.1)
Lymphs: 21 % (ref 14–46)
MCH: 31.3 pg (ref 26.6–33.0)
MCHC: 32.1 g/dL (ref 31.5–35.7)
MCV: 97 fL (ref 79–97)
Monocytes Absolute: 1 10*3/uL — ABNORMAL HIGH (ref 0.1–0.9)
Monocytes: 19 % — ABNORMAL HIGH (ref 4–12)
Neutrophils Absolute: 3.1 10*3/uL (ref 1.4–7.0)
Neutrophils Relative %: 60 % (ref 40–74)
RBC: 3.45 x10E6/uL — ABNORMAL LOW (ref 3.77–5.28)
RDW: 13.5 % (ref 12.3–15.4)
WBC: 5.2 10*3/uL (ref 3.4–10.8)

## 2012-08-30 ENCOUNTER — Non-Acute Institutional Stay: Payer: Medicare Other | Admitting: Internal Medicine

## 2012-08-30 ENCOUNTER — Encounter: Payer: Self-pay | Admitting: Internal Medicine

## 2012-08-30 VITALS — BP 118/70 | HR 62 | Wt 139.0 lb

## 2012-08-30 DIAGNOSIS — R609 Edema, unspecified: Secondary | ICD-10-CM

## 2012-08-30 DIAGNOSIS — G609 Hereditary and idiopathic neuropathy, unspecified: Secondary | ICD-10-CM

## 2012-08-30 DIAGNOSIS — M545 Low back pain: Secondary | ICD-10-CM

## 2012-08-30 DIAGNOSIS — E039 Hypothyroidism, unspecified: Secondary | ICD-10-CM

## 2012-08-30 MED ORDER — LEVOTHYROXINE SODIUM 125 MCG PO TABS: ORAL_TABLET | ORAL | Status: DC

## 2012-08-30 MED ORDER — FUROSEMIDE 40 MG PO TABS: ORAL_TABLET | ORAL | Status: DC

## 2012-08-30 NOTE — Progress Notes (Signed)
Subjective:    Patient ID: Sabrina Bishop, female    DOB: 1917/03/02, 79 y.o.   MRN: [id number]  HPI Chronic dyspnea. Has trouble with feeling off balance. Chronic back pain. Seems to be getting worse. Feet still swollen. Has noted a red discoloration around the nail of th left great and right 3rd toe. Neosporin seemed to improve the redness. Scaling area on the right cheek did not respond to steroid cream. Area is no worse than it was previously. BP is controlled. Lesion on left pinna is resolved, but she still has some pain. Tingling and numb feelings in feet at night.   Review of Systems  Constitutional: Positive for activity change and fatigue.  HENT: Positive for hearing loss and tinnitus. Negative for ear pain, congestion and neck stiffness.   Eyes: Negative.   Respiratory: Positive for shortness of breath.   Cardiovascular: Positive for leg swelling. Negative for chest pain and palpitations.  Gastrointestinal: Negative for abdominal pain and abdominal distention.  Endocrine: Negative.   Genitourinary: Positive for frequency.  Neurological: Positive for dizziness and weakness. Negative for tremors and syncope.  Hematological: Negative.   Psychiatric/Behavioral: Negative for behavioral problems, confusion and agitation.       Objective:   Physical Exam  Constitutional: She is oriented to person, place, and time. No distress.  frail  HENT:  Head: Normocephalic and atraumatic.  Partial deafness.  Eyes: Pupils are equal, round, and reactive to light.  Neck: Normal range of motion. Neck supple. No JVD present. No tracheal deviation present. No thyromegaly present.  Cardiovascular: Normal rate, regular rhythm, normal heart sounds and intact distal pulses.  Exam reveals no gallop and no friction rub.   No murmur heard. Pulmonary/Chest: Effort normal. No respiratory distress. She has no wheezes. She has rales. She exhibits no tenderness.  Abdominal: Bowel sounds are  normal. She exhibits no distension and no mass. There is no tenderness.  Musculoskeletal: Normal range of motion. She exhibits no edema and no tenderness.  Lymphadenopathy:    She has no cervical adenopathy.  Neurological: She is alert and oriented to person, place, and time. No cranial nerve deficit.  Skin: Skin is warm and dry. No rash noted. No erythema. No pallor.  Senile ecchymoses.  Psychiatric: She has a normal mood and affect. Her behavior is normal. Thought content normal.    Lab reports 09/17/09 EKG: Pacemaker. Rate 66 10/23/2010 BMP: glucose 95, BUN 32, Creatinine 1.35 TSH 3.089 03/09/2011 CMP: glucose 93, BUN 41, Creatinine 1.33 Lipid: cholesterol 226, triglyceride 115, HDL 59, LDL 144 TSH 5.501   03/17/11 EKG: Pacemaker functioning well at a rate of 67. All beats are preceded a pacemaker spike. 12/07/2011  BMP: glucose 87, BUN 31, Creatinine 1.58 Lipid: Cholesterol 239, Triglycerides 167, HDL 47, LDL 159 TSH: 12.666 02/08/2012 BMP: glucose 96, BUN 28, Creatinine 1.52 Lipid: cholesterol 229, triglyceride 153, HDL 5.0, LDL 152 TSH 4.166 BNP 295.7 03/21/2012 BMP: glucose 101, BUN 37, Creatinine 1.74 Lipid: cholesterol 154, triglyceride 108, HDL 46, LDL 86 05/23/2012 BMP: Sodium 141, Potassium 3.8, glucose 90, BUN 32, Creatinine 1.60  08/22/12 CMP: BUN 39, Creat 1.49   Lipids: TC 148, trig 102, HDL 44, LDL 84   TSH 9.462    Assessment & Plan:  1. Unspecified hypothyroidism Increase strength of levethyroxin. It appears to heave been reduced at the time of her last hospitalization. - levothyroxine (SYNTHROID, LEVOTHROID) 125 MCG tablet; One daily for thyroid supplement.  Dispense: 90 tablet; Refill: 3 - TSH; Future  2. Edema Related to venous insufficiency. - furosemide (LASIX) 40 MG tablet; Take 1 1/2 tablets (60mg ) daily for diuretic.  Dispense: 60 tablet; Refill: 5  3. Unspecified hereditary and idiopathic peripheral neuropathy Numb in feet.. She is not interested pursuing  PNCV at this time.  4. Lumbago Chronic. NO change.

## 2012-08-30 NOTE — Patient Instructions (Signed)
Increase levothyroxine to 125 mg daily for thyroid supplement.

## 2012-09-05 ENCOUNTER — Other Ambulatory Visit: Payer: Self-pay | Admitting: Internal Medicine

## 2012-09-14 ENCOUNTER — Other Ambulatory Visit: Payer: Self-pay | Admitting: Internal Medicine

## 2012-09-15 ENCOUNTER — Other Ambulatory Visit: Payer: Self-pay | Admitting: Internal Medicine

## 2012-09-16 ENCOUNTER — Encounter: Payer: Self-pay | Admitting: Internal Medicine

## 2012-10-25 ENCOUNTER — Other Ambulatory Visit: Payer: Self-pay

## 2012-10-25 DIAGNOSIS — E039 Hypothyroidism, unspecified: Secondary | ICD-10-CM

## 2012-10-25 DIAGNOSIS — R609 Edema, unspecified: Secondary | ICD-10-CM

## 2012-10-25 MED ORDER — HYDROCODONE-ACETAMINOPHEN 7.5-325 MG PO TABS: ORAL_TABLET | ORAL | Status: DC

## 2012-10-25 MED ORDER — FUROSEMIDE 40 MG PO TABS: ORAL_TABLET | ORAL | Status: DC

## 2012-10-25 MED ORDER — SPIRONOLACTONE 25 MG PO TABS: 25.0000 mg | ORAL_TABLET | Freq: Every day | ORAL | Status: DC

## 2012-10-25 MED ORDER — LEVOTHYROXINE SODIUM 125 MCG PO TABS: ORAL_TABLET | ORAL | Status: DC

## 2012-10-25 MED ORDER — LOSARTAN POTASSIUM 50 MG PO TABS: 50.0000 mg | ORAL_TABLET | Freq: Every day | ORAL | Status: DC

## 2012-10-25 MED ORDER — ATORVASTATIN CALCIUM 10 MG PO TABS: 10.0000 mg | ORAL_TABLET | Freq: Every day | ORAL | Status: DC

## 2012-10-25 MED ORDER — ATENOLOL 50 MG PO TABS: 50.0000 mg | ORAL_TABLET | Freq: Every day | ORAL | Status: DC

## 2012-10-25 MED ORDER — ISOSORBIDE MONONITRATE ER 30 MG PO TB24: ORAL_TABLET | ORAL | Status: DC

## 2012-11-29 ENCOUNTER — Encounter: Payer: Self-pay | Admitting: Internal Medicine

## 2012-11-29 ENCOUNTER — Non-Acute Institutional Stay: Payer: Medicare Other | Admitting: Internal Medicine

## 2012-11-29 VITALS — BP 138/74 | HR 72 | Ht <= 58 in | Wt 139.0 lb

## 2012-11-29 DIAGNOSIS — Z950 Presence of cardiac pacemaker: Secondary | ICD-10-CM

## 2012-11-29 DIAGNOSIS — R609 Edema, unspecified: Secondary | ICD-10-CM

## 2012-11-29 DIAGNOSIS — J4489 Other specified chronic obstructive pulmonary disease: Secondary | ICD-10-CM

## 2012-11-29 DIAGNOSIS — I10 Essential (primary) hypertension: Secondary | ICD-10-CM

## 2012-11-29 DIAGNOSIS — J449 Chronic obstructive pulmonary disease, unspecified: Secondary | ICD-10-CM

## 2012-11-29 DIAGNOSIS — E039 Hypothyroidism, unspecified: Secondary | ICD-10-CM

## 2012-11-29 NOTE — Progress Notes (Signed)
Subjective:    Patient ID: Sabrina Bishop, female    DOB: 03/05/1917, 79 y.o.   MRN: [id number]  HPI C/o scaling and breaking out on the face and chest.  Pain in the right heel for the last 2 nights. Using hydrocodone, which helps.   She thinks her memory is not as good. Passed MMSE today.  Edema is the same. Using spironolactone for the last 10 days along with her furosemide. She cannot tell any difference in the swelling.  Hypothyroid; TSH improved, but still high.  Current Outpatient Prescriptions on File Prior to Visit  Medication Sig Dispense Refill  . atenolol (TENORMIN) 50 MG tablet Take 1 tablet (50 mg total) by mouth daily.  90 tablet  3  . atorvastatin (LIPITOR) 10 MG tablet Take 1 tablet (10 mg total) by mouth daily.  90 tablet  3  . furosemide (LASIX) 40 MG tablet Take 1 1/2 tablets (60mg ) daily for diuretic.  180 tablet  3  . HYDROcodone-acetaminophen (NORCO) 7.5-325 MG per tablet Take one tablet every 4 hours as needed for pain  500 tablet  3  . isosorbide mononitrate (IMDUR) 30 MG 24 hr tablet Take 1/2 tablet daily  45 tablet  3  . levothyroxine (SYNTHROID, LEVOTHROID) 125 MCG tablet One daily for thyroid supplement.  90 tablet  3  . losartan (COZAAR) 50 MG tablet Take 1 tablet (50 mg total) by mouth daily.  90 tablet  3  . Multiple Vitamin (MULTIVITAMIN) capsule Take 1 capsule by mouth daily.        Marland Kitchen spironolactone (ALDACTONE) 25 MG tablet Take 1 tablet (25 mg total) by mouth daily.  90 tablet  3   No current facility-administered medications on file prior to visit.    Review of Systems  Constitutional: Positive for activity change and fatigue.  HENT: Positive for hearing loss and tinnitus. Negative for ear pain, congestion and neck stiffness.   Eyes: Negative.   Respiratory: Positive for shortness of breath.   Cardiovascular: Positive for leg swelling. Negative for chest pain and palpitations.  Gastrointestinal: Negative for abdominal pain and abdominal  distention.  Endocrine: Negative.   Genitourinary: Positive for frequency.  Neurological: Positive for dizziness and weakness. Negative for tremors and syncope.  Hematological: Negative.   Psychiatric/Behavioral: Negative for behavioral problems, confusion and agitation.       Objective:BP 138/74  Pulse 72  Ht 4\' 8"  (1.422 m)  Wt 139 lb (63.05 kg)  BMI 31.18 kg/m2    Physical Exam  Constitutional: She is oriented to person, place, and time. No distress.  frail  HENT:  Head: Normocephalic and atraumatic.  Partial deafness.  Eyes: Pupils are equal, round, and reactive to light.  Neck: Normal range of motion. Neck supple. No JVD present. No tracheal deviation present. No thyromegaly present.  Cardiovascular: Normal rate, regular rhythm, normal heart sounds and intact distal pulses.  Exam reveals no gallop and no friction rub.   No murmur heard. Pulmonary/Chest: Effort normal. No respiratory distress. She has no wheezes. She has rales. She exhibits no tenderness.  Abdominal: Bowel sounds are normal. She exhibits no distension and no mass. There is no tenderness.  Musculoskeletal: Normal range of motion. She exhibits no edema and no tenderness.  Lymphadenopathy:    She has no cervical adenopathy.  Neurological: She is alert and oriented to person, place, and time. No cranial nerve deficit.  Skin: Skin is warm and dry. No rash noted. No erythema. No pallor.  Senile ecchymoses.  Psychiatric: She has a normal mood and affect. Her behavior is normal. Thought content normal.  MMSE 30/30. Passed clock drawing.  LAB 08/30/12 TSH 9.462 11/21/12 TSH 8.714     Assessment & Plan:  Edema: unchanged  COPD: SOB with exertion and at rest.  Pacemaker-mdt DDD: functioning OK  HYPOTHYROIDISM: improving. TSH still high  HYPERTENSION: controlled

## 2012-11-29 NOTE — Patient Instructions (Signed)
-  continue current medications

## 2012-11-29 NOTE — Progress Notes (Signed)
Failed clock drawing  

## 2012-12-15 ENCOUNTER — Encounter: Payer: Self-pay | Admitting: Internal Medicine

## 2013-01-10 ENCOUNTER — Encounter: Payer: Medicare Other | Admitting: Internal Medicine

## 2013-01-23 ENCOUNTER — Encounter: Payer: Medicare Other | Admitting: Internal Medicine

## 2013-02-15 ENCOUNTER — Ambulatory Visit (INDEPENDENT_AMBULATORY_CARE_PROVIDER_SITE_OTHER): Payer: Medicare Other | Admitting: Internal Medicine

## 2013-02-15 ENCOUNTER — Encounter: Payer: Self-pay | Admitting: Internal Medicine

## 2013-02-15 VITALS — HR 79 | Ht <= 58 in | Wt 141.8 lb

## 2013-02-15 DIAGNOSIS — I5032 Chronic diastolic (congestive) heart failure: Secondary | ICD-10-CM

## 2013-02-15 DIAGNOSIS — Z950 Presence of cardiac pacemaker: Secondary | ICD-10-CM

## 2013-02-15 DIAGNOSIS — I442 Atrioventricular block, complete: Secondary | ICD-10-CM

## 2013-02-15 DIAGNOSIS — I4891 Unspecified atrial fibrillation: Secondary | ICD-10-CM

## 2013-02-15 DIAGNOSIS — D66 Hereditary factor VIII deficiency: Secondary | ICD-10-CM

## 2013-02-15 HISTORY — DX: Unspecified atrial fibrillation: I48.91

## 2013-02-15 LAB — BASIC METABOLIC PANEL
BUN: 42 mg/dL — ABNORMAL HIGH (ref 6–23)
CO2: 29 mEq/L (ref 19–32)
Calcium: 9.5 mg/dL (ref 8.4–10.5)
Chloride: 99 mEq/L (ref 96–112)
Creatinine, Ser: 1.5 mg/dL — ABNORMAL HIGH (ref 0.4–1.2)
Glucose, Bld: 108 mg/dL — ABNORMAL HIGH (ref 70–99)
Potassium: 3.7 mEq/L (ref 3.5–5.1)

## 2013-02-15 LAB — PACEMAKER DEVICE OBSERVATION
AL IMPEDENCE PM: 512 Ohm
ATRIAL PACING PM: 54
BAMS-0001: 140 {beats}/min
BATTERY VOLTAGE: 2.79 V
RV LEAD IMPEDENCE PM: 475 Ohm
RV LEAD THRESHOLD: 0.75 V
VENTRICULAR PACING PM: 100

## 2013-02-15 MED ORDER — APIXABAN 2.5 MG PO TABS: 2.5000 mg | ORAL_TABLET | Freq: Two times a day (BID) | ORAL | Status: DC

## 2013-02-15 NOTE — Assessment & Plan Note (Signed)
The patient's device was interrogated.  The information was reviewed. No changes were made in the programming.    

## 2013-02-15 NOTE — Progress Notes (Signed)
skr      Patient Care Team: Kimber Relic, MD as PCP - General (Internal Medicine) [facility] Duke Salvia, MD as Consulting Physician (Cardiology)   HPI  Sabrina Bishop is a 79 y.o. female Seen in followup for complete heart block. She has sinus node dysfunction also. She is status post pacemaker implantation with generator replacement January 14   She continues to complain of dyspnea on exertion        Past Medical History  Diagnosis Date  . Asthma   . Hypothyroidism   . OA (osteoarthritis)   . HTN (hypertension)   . Diastolic dysfunction   . AV BLOCK, COMPLETE 03/26/2009    Qualifier: Diagnosis of  By: Graciela Husbands, MD, Ruthann Cancer Ty Hilts   . Pacemaker-mdt DDD 12/16/2010  . Keratoconus, unspecified   . Unspecified venous (peripheral) insufficiency   . COPD (chronic obstructive pulmonary disease)   . Disturbance of salivary secretion   . GERD (gastroesophageal reflux disease)   . Osteoarthrosis, unspecified whether generalized or localized, unspecified site   . Pain in joint, ankle and foot   . Spinal stenosis, lumbar region, without neurogenic claudication   . Lumbago   . Trigger finger (acquired)   . Abnormality of gait   . Edema   . Shortness of breath   . Dysphagia, unspecified(787.20)   . Cardiac pacemaker in situ   . Seborrheic keratosis 05/2012  . Other atopic dermatitis and related conditions 03/2012  . Pneumonia, organism unspecified 03/08/2012  . Unspecified diastolic heart failure 02/16/2012  . Mixed hyperlipidemia 08/2011  . Chronic kidney disease, stage II (mild) 08/2011  . Hypoxemia 08/2011  . Enthesopathy of hip region 01/2011  . Keratoconjunctivitis 2011  . Atrial fibrillation 02/15/2013    Past Surgical History  Procedure Laterality Date  . Total knee arthroplasty  2005    left  . Abdominal hysterectomy  1950  . Pacemaker generator change  04/2012    Dr. Graciela Husbands  . Pacemaker placement  2004  . Total knee arthroplasty  1995   right  . Cataract extraction w/ intraocular lens implant  2012    right    Current Outpatient Prescriptions  Medication Sig Dispense Refill  . atenolol (TENORMIN) 50 MG tablet Take 1 tablet (50 mg total) by mouth daily.  90 tablet  3  . atorvastatin (LIPITOR) 10 MG tablet Take 1 tablet (10 mg total) by mouth daily.  90 tablet  3  . furosemide (LASIX) 40 MG tablet Take 1 1/2 tablets (60mg ) daily for diuretic.  180 tablet  3  . HYDROcodone-acetaminophen (NORCO) 7.5-325 MG per tablet Take one tablet every 4 hours as needed for pain  500 tablet  3  . isosorbide mononitrate (IMDUR) 30 MG 24 hr tablet Take 1/2 tablet daily  45 tablet  3  . levothyroxine (SYNTHROID, LEVOTHROID) 125 MCG tablet One daily for thyroid supplement.  90 tablet  3  . losartan (COZAAR) 50 MG tablet Take 1 tablet (50 mg total) by mouth daily.  90 tablet  3  . Multiple Vitamin (MULTIVITAMIN) capsule Take 1 capsule by mouth daily.        Marland Kitchen spironolactone (ALDACTONE) 25 MG tablet Take 1 tablet (25 mg total) by mouth daily.  90 tablet  3   No current facility-administered medications for this visit.    Allergies  Allergen Reactions  . Adhesive [Tape] Rash    Review of Systems negative except from HPI and PMH  Physical Exam Pulse 79  Ht 4\' 8"  (1.422 m)  Wt 141 lb 12.8 oz (64.32 kg)  BMI 31.81 kg/m2 Well developed and well nourished in no acute distress HENT normal E scleral and icterus clear Neck Supple JVP flat; carotids brisk and full Clear to ausculation Device pocket well healed; without hematoma or erythema.  There is no tethering regular rate and rhythm  no murmurs gallops or rub Soft with active bowel sounds No clubbing cyanosis 2+ Edema Alert and oriented, grossly normal motor and sensory function Skin Warm and Dry ECG demonstrates a v  Pacing with transition in lead V3 from upright   Assessment and  Plan

## 2013-02-15 NOTE — Assessment & Plan Note (Addendum)
Newly identified by her device and verified by electrogram   evaluation. CHADS-VASc score of greater than or equal to 4. We have discussed risks and benefits of anticoagulation. She wonders about whether she should do anything. We will plan to proceed with apixaban 2.5 mg twice daily based on her age weight and renal function

## 2013-02-15 NOTE — Assessment & Plan Note (Signed)
We'll try empiric trial of decreasing her diuretics twice a day every other day for about 10 days to see if we can make a difference in her dyspnea. We discussed potential role of an echo. We'll defer that at present

## 2013-02-15 NOTE — Patient Instructions (Addendum)
Remote monitoring is used to monitor your Pacemaker of ICD from home. This monitoring reduces the number of office visits required to check your device to one time per year. It allows [place] to keep an eye on the functioning of your device to ensure it is working properly. You are scheduled for a device check from home on 05/19/2013. You may send your transmission at any time that day. If you have a wireless device, the transmission will be sent automatically. After your physician reviews your transmission, you will receive a postcard with your next transmission date.  Your physician recommends that you have lab work today: BMET  Your physician has recommended you make the following change in your medication:  1) Increase Lasix - alternate taking 60mg  twice daily for one day, then next day take 60mg  once daily - continue this pattern for 10 days, then return to normal 60mg  daily 2) Start Eliquis 2.5 mg twice daily

## 2013-02-16 ENCOUNTER — Encounter: Payer: Self-pay | Admitting: Internal Medicine

## 2013-02-16 ENCOUNTER — Telehealth: Payer: Self-pay | Admitting: *Deleted

## 2013-02-16 NOTE — Telephone Encounter (Signed)
[facility]  approval for eliquis medication PA # [id number] for 1 year from todays date.

## 2013-04-11 ENCOUNTER — Other Ambulatory Visit: Payer: Self-pay | Admitting: *Deleted

## 2013-04-11 MED ORDER — HYDROCODONE-ACETAMINOPHEN 7.5-325 MG PO TABS: ORAL_TABLET | ORAL | Status: DC

## 2013-04-20 DIAGNOSIS — L570 Actinic keratosis: Secondary | ICD-10-CM

## 2013-04-20 DIAGNOSIS — L858 Other specified epidermal thickening: Secondary | ICD-10-CM

## 2013-04-20 DIAGNOSIS — M25519 Pain in unspecified shoulder: Secondary | ICD-10-CM

## 2013-04-20 HISTORY — DX: Actinic keratosis: L57.0

## 2013-04-20 HISTORY — DX: Pain in unspecified shoulder: M25.519

## 2013-04-20 HISTORY — DX: Other specified epidermal thickening: L85.8

## 2013-04-21 ENCOUNTER — Other Ambulatory Visit: Payer: Self-pay | Admitting: *Deleted

## 2013-04-21 MED ORDER — HYDROCODONE-ACETAMINOPHEN 7.5-325 MG PO TABS: ORAL_TABLET | ORAL | Status: DC

## 2013-04-25 ENCOUNTER — Other Ambulatory Visit: Payer: Self-pay

## 2013-04-25 ENCOUNTER — Telehealth: Payer: Self-pay

## 2013-04-25 ENCOUNTER — Telehealth: Payer: Self-pay | Admitting: *Deleted

## 2013-04-25 MED ORDER — HYDROCODONE-ACETAMINOPHEN 7.5-325 MG PO TABS: ORAL_TABLET | ORAL | Status: DC

## 2013-04-25 NOTE — Telephone Encounter (Signed)
[facility] for her Hydrocodone/APAP 7.5/325. Rx was faxed to Mail order pharm, but they won't take fax copy, needs hard copy to mail in. Also, can't be for more than #100 tablets with no refill. Rx written given to Dr. Chilton SiGreen to sign. Called [facility] per patient request to make sure she has the correct mailing address for [facility]. Gave Rx to patient.

## 2013-04-25 NOTE — Telephone Encounter (Signed)
Received a fax from [facility] and someone tried to fax Hydrocodone 7.5/325 Rx to them and they do no accept faxed Narcotic Rx's. Patient needs a hardcopy. Called patient and patient stated that she would go down to the clinic and have Eunice BlaseDebbie write a Rx for her to mail off.

## 2013-05-19 ENCOUNTER — Encounter: Payer: Self-pay | Admitting: Internal Medicine

## 2013-05-19 ENCOUNTER — Ambulatory Visit (INDEPENDENT_AMBULATORY_CARE_PROVIDER_SITE_OTHER): Payer: Medicare Other | Admitting: *Deleted

## 2013-05-19 DIAGNOSIS — I4891 Unspecified atrial fibrillation: Secondary | ICD-10-CM

## 2013-05-19 DIAGNOSIS — I442 Atrioventricular block, complete: Secondary | ICD-10-CM

## 2013-05-28 LAB — MDC_IDC_ENUM_SESS_TYPE_REMOTE
Battery Impedance: 103 Ohm
Brady Statistic AP VP Percent: 53 %
Brady Statistic AP VS Percent: 0 %
Brady Statistic AS VP Percent: 47 %
Brady Statistic AS VS Percent: 0 %
Date Time Interrogation Session: 20150130215751
Lead Channel Impedance Value: 484 Ohm
Lead Channel Impedance Value: 519 Ohm
Lead Channel Pacing Threshold Amplitude: 1 V
Lead Channel Pacing Threshold Pulse Width: 0.4 ms
Lead Channel Sensing Intrinsic Amplitude: 2.8 mV
Lead Channel Setting Pacing Amplitude: 2.5 V
Lead Channel Setting Sensing Sensitivity: 2.8 mV
MDC IDC MSMT BATTERY REMAINING LONGEVITY: 117 mo
MDC IDC MSMT BATTERY VOLTAGE: 2.79 V
MDC IDC MSMT LEADCHNL RV PACING THRESHOLD AMPLITUDE: 0.625 V
MDC IDC MSMT LEADCHNL RV PACING THRESHOLD PULSEWIDTH: 0.4 ms
MDC IDC SET LEADCHNL RA PACING AMPLITUDE: 2 V
MDC IDC SET LEADCHNL RV PACING PULSEWIDTH: 0.4 ms

## 2013-05-29 LAB — TSH: TSH: 0.26 u[IU]/mL — AB (ref 0.41–5.90)

## 2013-05-29 LAB — BASIC METABOLIC PANEL
BUN: 53 mg/dL — AB (ref 4–21)
CREATININE: 1.7 mg/dL — AB (ref 0.5–1.1)
Glucose: 105 mg/dL
POTASSIUM: 4.1 mmol/L (ref 3.4–5.3)
Sodium: 136 mmol/L — AB (ref 137–147)

## 2013-05-29 LAB — LIPID PANEL
CHOLESTEROL: 136 mg/dL (ref 0–200)
HDL: 43 mg/dL (ref 35–70)
LDL Cholesterol: 74 mg/dL
TRIGLYCERIDES: 93 mg/dL (ref 40–160)

## 2013-05-29 LAB — HEPATIC FUNCTION PANEL
ALT: 8 U/L (ref 7–35)
AST: 18 U/L (ref 13–35)
Alkaline Phosphatase: 61 U/L (ref 25–125)
Bilirubin, Total: 0.4 mg/dL

## 2013-05-30 ENCOUNTER — Other Ambulatory Visit: Payer: Self-pay

## 2013-05-30 MED ORDER — HYDROCODONE-ACETAMINOPHEN 7.5-325 MG PO TABS: ORAL_TABLET | ORAL | Status: DC

## 2013-06-06 ENCOUNTER — Other Ambulatory Visit: Payer: Self-pay | Admitting: Internal Medicine

## 2013-06-06 ENCOUNTER — Encounter: Payer: Self-pay | Admitting: Internal Medicine

## 2013-06-06 MED ORDER — HYDROCODONE-ACETAMINOPHEN 7.5-325 MG PO TABS: ORAL_TABLET | ORAL | Status: DC

## 2013-06-14 ENCOUNTER — Encounter: Payer: Self-pay | Admitting: *Deleted

## 2013-06-27 ENCOUNTER — Encounter: Payer: Self-pay | Admitting: Internal Medicine

## 2013-06-27 ENCOUNTER — Non-Acute Institutional Stay: Payer: Medicare Other | Admitting: Internal Medicine

## 2013-06-27 ENCOUNTER — Telehealth: Payer: Self-pay

## 2013-06-27 VITALS — BP 110/72 | HR 60 | Temp 98.0°F | Ht <= 58 in | Wt 140.0 lb

## 2013-06-27 DIAGNOSIS — R0989 Other specified symptoms and signs involving the circulatory and respiratory systems: Secondary | ICD-10-CM

## 2013-06-27 DIAGNOSIS — R0602 Shortness of breath: Secondary | ICD-10-CM | POA: Insufficient documentation

## 2013-06-27 DIAGNOSIS — I872 Venous insufficiency (chronic) (peripheral): Secondary | ICD-10-CM | POA: Insufficient documentation

## 2013-06-27 DIAGNOSIS — I4891 Unspecified atrial fibrillation: Secondary | ICD-10-CM

## 2013-06-27 DIAGNOSIS — R609 Edema, unspecified: Secondary | ICD-10-CM

## 2013-06-27 DIAGNOSIS — I5030 Unspecified diastolic (congestive) heart failure: Secondary | ICD-10-CM

## 2013-06-27 DIAGNOSIS — M48061 Spinal stenosis, lumbar region without neurogenic claudication: Secondary | ICD-10-CM

## 2013-06-27 DIAGNOSIS — Z950 Presence of cardiac pacemaker: Secondary | ICD-10-CM

## 2013-06-27 DIAGNOSIS — R0609 Other forms of dyspnea: Secondary | ICD-10-CM

## 2013-06-27 DIAGNOSIS — J449 Chronic obstructive pulmonary disease, unspecified: Secondary | ICD-10-CM

## 2013-06-27 DIAGNOSIS — E039 Hypothyroidism, unspecified: Secondary | ICD-10-CM

## 2013-06-27 DIAGNOSIS — M76899 Other specified enthesopathies of unspecified lower limb, excluding foot: Secondary | ICD-10-CM

## 2013-06-27 DIAGNOSIS — I10 Essential (primary) hypertension: Secondary | ICD-10-CM

## 2013-06-27 MED ORDER — APIXABAN 2.5 MG PO TABS: ORAL_TABLET | ORAL | Status: DC

## 2013-06-27 MED ORDER — LEVOTHYROXINE SODIUM 100 MCG PO TABS: ORAL_TABLET | ORAL | Status: DC

## 2013-06-27 NOTE — Telephone Encounter (Signed)
Called [facility] for appointment for cancer left hand and possibly right arm. Was told to fax referral to [phone], then they will make appt., first appt isn't until May. Patient aware of this, find with her, "she's not in any hurry, I have had this for awhile".

## 2013-06-27 NOTE — Progress Notes (Deleted)
Patient ID: Sabrina Bishop, female   DOB: 02/26/1917, 80 y.o.   MRN: [id number]    Location:  [facility]   Place of Service: Clinic (12)    Allergies  Allergen Reactions  . Adhesive [Tape] Rash    Chief Complaint  Patient presents with  . Medical Managment of Chronic Issues    Comprehensive exam: blood pressure, thyroid, gait, memory    HPI:  ***  Medications: Patient's Medications  New Prescriptions   No medications on file  Previous Medications   ATENOLOL (TENORMIN) 50 MG TABLET    Take 1 tablet (50 mg total) by mouth daily.   ATORVASTATIN (LIPITOR) 10 MG TABLET    Take 1 tablet (10 mg total) by mouth daily.   FUROSEMIDE (LASIX) 40 MG TABLET    Take 1 1/2 tablets (60mg ) daily for diuretic.   HYDROCODONE-ACETAMINOPHEN (NORCO) 7.5-325 MG PER TABLET    Take one tablet every 4 hours as needed for pain   ISOSORBIDE MONONITRATE (IMDUR) 30 MG 24 HR TABLET    Take 1/2 tablet daily   LEVOTHYROXINE (SYNTHROID, LEVOTHROID) 125 MCG TABLET    One daily for thyroid supplement.   LOSARTAN (COZAAR) 50 MG TABLET    Take 1 tablet (50 mg total) by mouth daily.   SPIRONOLACTONE (ALDACTONE) 25 MG TABLET    Take 1 tablet (25 mg total) by mouth daily.  Modified Medications   No medications on file  Discontinued Medications   APIXABAN (ELIQUIS) 2.5 MG TABS TABLET    Take 1 tablet (2.5 mg total) by mouth 2 (two) times daily.   APIXABAN (ELIQUIS) 2.5 MG TABS TABLET    Take 1 tablet (2.5 mg total) by mouth 2 (two) times daily.   MULTIPLE VITAMIN (MULTIVITAMIN) CAPSULE    Take 1 capsule by mouth daily.       Review of Systems  Filed Vitals:   06/27/13 1016  BP: 110/72  Pulse: 60  Temp: 98 F (36.7 C)  TempSrc: Oral  Height: 4' 8.5" (1.435 m)  Weight: 140 lb (63.504 kg)  SpO2: 98%   Physical Exam   Labs reviewed: Nursing Home on 06/27/2013  Component Date Value Ref Range Status  . Glucose 05/29/2013 105   Final  . BUN 05/29/2013 53* 4 - 21 mg/dL Final  . Creatinine  25/36/6440 1.7* 0.5 - 1.1 mg/dL Final  . Potassium 34/74/2595 4.1  3.4 - 5.3 mmol/L Final  . Sodium 05/29/2013 136* 137 - 147 mmol/L Final  . Triglycerides 05/29/2013 93  40 - 160 mg/dL Final  . Cholesterol 63/87/5643 136  0 - 200 mg/dL Final  . HDL 32/95/1884 43  35 - 70 mg/dL Final  . LDL Cholesterol 05/29/2013 74   Final  . Alkaline Phosphatase 05/29/2013 61  25 - 125 U/L Final  . ALT 05/29/2013 8  7 - 35 U/L Final  . AST 05/29/2013 18  13 - 35 U/L Final  . Bilirubin, Total 05/29/2013 0.4   Final  . TSH 05/29/2013 0.26* 0.41 - 5.90 uIU/mL Final  Clinical Support on 05/19/2013  Component Date Value Ref Range Status  . Date Time Interrogation Session 05/28/2013 [id number]   Final  . Pulse Generator Manufacturer 05/28/2013 Medtronic   Final  . Pulse Gen Model 05/28/2013 [id number] Jana Half   Final  . Pulse Gen Serial Number 05/28/2013 [id number] H   Final  . RV Sense Sensitivity 05/28/2013 2.8   Final  . RA Pace Amplitude 05/28/2013 2   Final  . RV  Pace PulseWidth 05/28/2013 0.4   Final  . RV Pace Amplitude 05/28/2013 2.5   Final  . RA Impedance 05/28/2013 519   Final  . RA Amplitude 05/28/2013 2.8   Final  . RA Pacing Amplitude 05/28/2013 1.00   Final  . RA Pacing PulseWidth 05/28/2013 0.40   Final  . RV IMPEDANCE 05/28/2013 484   Final  . RV Pacing Amplitude 05/28/2013 0.625   Final  . RV Pacing PulseWidth 05/28/2013 0.40   Final  . Battery Status 05/28/2013 Unknown   Final  . Battery Longevity 05/28/2013 117   Final  . Battery Voltage 05/28/2013 2.79   Final  . Battery Impedance 05/28/2013 103   Final  . Huston FoleyBrady AP VP Percent 05/28/2013 53   Final  . [place]on FoleyBrady AS VP Percent 05/28/2013 47   Final  . [place]on FoleyBrady AP VS Percent 05/28/2013 0   Final  . [place] FoleyBrady AS VS Percent 05/28/2013 0   Final  . Eval Rhythm 05/28/2013 AP/VP   Final  . Miscellaneous Comment 05/28/2013    Final                   Value:Pacemaker remote check. Device function reviewed. Impedance, sensing, auto capture thresholds  consistent with previous measurements. Histograms appropriate for patient and level of activity. All other diagnostic data reviewed and is appropriate and                          stable for patient. Real time/magnet EGM shows appropriate sensing and capture. Pt in AF 2.2% of time. + Eliquis. No ventricular high rate episodes. Estimated longevity 9.5 years. [facility] 08-22-13 and ROV in October with SK.      Assessment/Plan

## 2013-06-27 NOTE — Progress Notes (Signed)
Patient ID: Sabrina Bishop, female   DOB: 04/16/1917, 80 y.o.   MRN: [id number]    Location:  [facility]   Place of Service: Clinic (12)  PCP: Kimber Relic, MD  Code Status: DNR, MOST  Extended Emergency Contact Information Primary Emergency Contact: Reita Cliche Address: [address]          [place] Home Phone: [phone] Work Phone: [phone] Relation: Son Secondary Emergency Contact: Pierce Crane, [place] Home Phone: [phone] Mobile Phone: [phone] Relation: Son  Allergies  Allergen Reactions  . Adhesive [Tape] Rash    Chief Complaint  Patient presents with  . Medical Managment of Chronic Issues    Comprehensive exam: blood pressure, thyroid, gait, memory    HPI:  HYPERTENSION: controlled  Atrial fibrillation: currently in NSR. She has not been taking the Eliquis.  COPD: chronic dyspnea. Denies cough.  HYPOTHYROIDISM: TSH running low.   Edema: chronic 2+in both ankles. Likely related to chronic venous insufficiency. Using higher doses of furosemide makes BUN and creatinine rise.  Dyspnea on exertion: chronic  Pacemaker-mdt DDD: followed by Dr. Graciela Husbands  Unspecified venous (peripheral) insufficiency: contributes to edema  Enthesopathy of hip region: resolved right hip and groin pain  Unspecified diastolic heart failure: compensated  Shortness of breath: related to COPD and age  Spinal stenosis, lumbar region, without neurogenic claudication: chronic back pains are better      Past Medical History  Diagnosis Date  . Asthma   . Hypothyroidism   . OA (osteoarthritis)   . HTN (hypertension)   . Diastolic dysfunction   . AV BLOCK, COMPLETE 03/26/2009    Qualifier: Diagnosis of  By: Graciela Husbands, MD, Ruthann Cancer Ty Hilts   . Pacemaker-mdt DDD 12/16/2010  . Keratoconus, unspecified   . COPD (chronic obstructive pulmonary disease)   .  Disturbance of salivary secretion   . GERD (gastroesophageal reflux disease)   . Osteoarthrosis, unspecified whether generalized or localized, unspecified site   . Pain in joint, ankle and foot   . Spinal stenosis, lumbar region, without neurogenic claudication   . Lumbago   . Trigger finger (acquired)   . Abnormality of gait   . Edema   . Shortness of breath   . Dysphagia, unspecified(787.20)   . Seborrheic keratosis 05/2012  . Other atopic dermatitis and related conditions 03/2012  . Pneumonia, organism unspecified 03/08/2012  . Unspecified diastolic heart failure 02/16/2012  . Mixed hyperlipidemia 08/2011  . Chronic kidney disease, stage II (mild) 08/2011  . Hypoxemia 08/2011  . Enthesopathy of hip region 01/2011  . Keratoconjunctivitis 2011  . Atrial fibrillation 02/15/2013  . Unspecified venous (peripheral) insufficiency 2010  . Pain in joint, shoulder region 2015    left  . Keratoacanthoma of hand 2015    left  . Actinic keratoses 2015    right arm and left cheek    Past Surgical History  Procedure Laterality Date  . Total knee arthroplasty  2005    left  . Abdominal hysterectomy  1950  . Pacemaker generator change  04/2012    Dr. Graciela Husbands  . Pacemaker placement  2004  . Total knee arthroplasty  1995    right  . Cataract extraction w/ intraocular lens implant  2012    right    CONSULTANTS Cardio: Graciela Husbands Pastic Surg: Alean Rinne Ophth: Giegenback Ortho: A. Blanchie ServePenni Bombard  PAST PROCEDURES 2005 Pacemaker Graciela Husbands) 2006  Bone density 2011 Cataract Surgical [facility]) 2008 Nose graft ([place]) 12/04/09 Swallowing Study: esophageal spasm  Social History: History   Social History  . Marital Status: Widowed    Spouse Name: N/A    Number of Children: N/A  . Years of Education: N/A   Occupational History  . Retired Child psychotherapist   .     Social History Main Topics  . Smoking status: Never Smoker   . Smokeless tobacco: Never Used  . Alcohol Use: No  . Drug Use: No    . Sexual Activity: No   Other Topics Concern  . None   Social History Narrative   Lives at [facility]    Widowed   Pacemaker   Using walker    Family History Family Status  Relation Status Death Age  . Mother Deceased 70    accidental death  . Father Deceased 53  . Brother Deceased   . Son Alive   . Brother Deceased   . Son Alive   . Son Alive    Family History  Problem Relation Age of Onset  . Hyperlipidemia    . Hypertension    . Osteoporosis    . Stroke Father   . Congestive Heart Failure Brother   . Congestive Heart Failure Brother      Medications: Patient's Medications  New Prescriptions   No medications on file  Previous Medications   ATENOLOL (TENORMIN) 50 MG TABLET    Take 1 tablet (50 mg total) by mouth daily.   ATORVASTATIN (LIPITOR) 10 MG TABLET    Take 1 tablet (10 mg total) by mouth daily.   FUROSEMIDE (LASIX) 40 MG TABLET    Take 1 1/2 tablets (60mg ) daily for diuretic.   HYDROCODONE-ACETAMINOPHEN (NORCO) 7.5-325 MG PER TABLET    Take one tablet every 4 hours as needed for pain   ISOSORBIDE MONONITRATE (IMDUR) 30 MG 24 HR TABLET    Take 1/2 tablet daily   LEVOTHYROXINE (SYNTHROID, LEVOTHROID) 125 MCG TABLET    One daily for thyroid supplement.   LOSARTAN (COZAAR) 50 MG TABLET    Take 1 tablet (50 mg total) by mouth daily.   SPIRONOLACTONE (ALDACTONE) 25 MG TABLET    Take 1 tablet (25 mg total) by mouth daily.  Modified Medications   No medications on file  Discontinued Medications   APIXABAN (ELIQUIS) 2.5 MG TABS TABLET    Take 1 tablet (2.5 mg total) by mouth 2 (two) times daily.   APIXABAN (ELIQUIS) 2.5 MG TABS TABLET    Take 1 tablet (2.5 mg total) by mouth 2 (two) times daily.   MULTIPLE VITAMIN (MULTIVITAMIN) CAPSULE    Take 1 capsule by mouth daily.      Immunization History  Administered Date(s) Administered  . DTaP 04/21/2003  . Influenza Whole 02/19/2000, 01/16/2008, 01/17/2009  . Influenza-Unspecified 02/21/2013  .  Pneumococcal Conjugate-13 04/20/2008  . Pneumococcal Polysaccharide-23 04/21/2003     Review of Systems  Constitutional: Positive for activity change and fatigue.  HENT: Positive for hearing loss and tinnitus. Negative for congestion and ear pain.   Eyes: Negative.   Respiratory: Positive for shortness of breath.   Cardiovascular: Positive for leg swelling. Negative for chest pain and palpitations.  Gastrointestinal: Negative for abdominal pain and abdominal distention.  Endocrine: Negative.   Genitourinary: Positive for frequency.  Musculoskeletal: Positive for arthralgias, back pain and gait problem. Negative for neck stiffness.       Poor balance  Skin:  Large keratoacanthoma left hand dorsum. Several patches of likely actinic keratoses at right upper arm and left cheek.  Neurological: Positive for dizziness and weakness. Negative for tremors and syncope.  Hematological: Negative.   Psychiatric/Behavioral: Negative for behavioral problems, confusion and agitation.      Filed Vitals:   06/27/13 1016  BP: 110/72  Pulse: 60  Temp: 98 F (36.7 C)  TempSrc: Oral  Height: 4' 8.5" (1.435 m)  Weight: 140 lb (63.504 kg)  SpO2: 98%   Physical Exam  Constitutional: She is oriented to person, place, and time. She appears well-developed and well-nourished. No distress.  HENT:  Right Ear: External ear normal.  Left Ear: External ear normal.  Nose: Nose normal.  Mouth/Throat: Oropharynx is clear and moist. No oropharyngeal exudate.  Eyes: Conjunctivae and EOM are normal. Pupils are equal, round, and reactive to light.  Neck: No JVD present. No tracheal deviation present. No thyromegaly present.  Cardiovascular: Normal rate, normal heart sounds and intact distal pulses.  Exam reveals no gallop and no friction rub.   No murmur heard. Premature beats. Pacemaker in left upper chest wall. Moderate varicose veins.  Respiratory: No respiratory distress. She has no wheezes. She has  rales. She exhibits no tenderness.  GI: She exhibits no distension and no mass. There is no guarding.  Musculoskeletal: Normal range of motion. She exhibits edema. She exhibits no tenderness.  Left shoulder pains in the mornings. Unstable gait. using walker in apt. and motorized wheelchair for longer distances.  Lymphadenopathy:    She has no cervical adenopathy.  Neurological: She is alert and oriented to person, place, and time. She has normal reflexes. No cranial nerve deficit. Coordination normal.  Reduced vibratory sensations in both legs. Left is less sensitive than the right.  Skin:  Large keratoacanthoma left hand dorsum. Several patches of likely actinic keratoses at right upper arm and left cheek  Psychiatric: She has a normal mood and affect. Her behavior is normal. Judgment and thought content normal.       Labs reviewed: Nursing Home on 06/27/2013  Component Date Value Ref Range Status  . Glucose 05/29/2013 105   Final  . BUN 05/29/2013 53* 4 - 21 mg/dL Final  . Creatinine 16/10/960402/12/2013 1.7* 0.5 - 1.1 mg/dL Final  . Potassium 54/09/811902/12/2013 4.1  3.4 - 5.3 mmol/L Final  . Sodium 05/29/2013 136* 137 - 147 mmol/L Final  . Triglycerides 05/29/2013 93  40 - 160 mg/dL Final  . Cholesterol 14/78/295602/12/2013 136  0 - 200 mg/dL Final  . HDL 21/30/865702/12/2013 43  35 - 70 mg/dL Final  . LDL Cholesterol 05/29/2013 74   Final  . Alkaline Phosphatase 05/29/2013 61  25 - 125 U/L Final  . ALT 05/29/2013 8  7 - 35 U/L Final  . AST 05/29/2013 18  13 - 35 U/L Final  . Bilirubin, Total 05/29/2013 0.4   Final  . TSH 05/29/2013 0.26* 0.41 - 5.90 uIU/mL Final  Clinical Support on 05/19/2013  Component Date Value Ref Range Status  . Date Time Interrogation Session 05/28/2013 [id number]   Final  . Pulse Generator Manufacturer 05/28/2013 Medtronic   Final  . Pulse Gen Model 05/28/2013 [id number] Jana HalfSensia   Final  . Pulse Gen Serial Number 05/28/2013 [id number]   Final  . RV Sense Sensitivity 05/28/2013 2.8    Final  . RA Pace Amplitude 05/28/2013 2   Final  . RV Pace PulseWidth 05/28/2013 0.4   Final  . RV Pace Amplitude 05/28/2013 2.5  Final  . RA Impedance 05/28/2013 519   Final  . RA Amplitude 05/28/2013 2.8   Final  . RA Pacing Amplitude 05/28/2013 1.00   Final  . RA Pacing PulseWidth 05/28/2013 0.40   Final  . RV IMPEDANCE 05/28/2013 484   Final  . RV Pacing Amplitude 05/28/2013 0.625   Final  . RV Pacing PulseWidth 05/28/2013 0.40   Final  . Battery Status 05/28/2013 Unknown   Final  . Battery Longevity 05/28/2013 117   Final  . Battery Voltage 05/28/2013 2.79   Final  . Battery Impedance 05/28/2013 103   Final  . Huston Foley AP VP Percent 05/28/2013 53   Final  . Huston Foley AS VP Percent 05/28/2013 47   Final  . Huston Foley AP VS Percent 05/28/2013 0   Final  . Huston Foley AS VS Percent 05/28/2013 0   Final  . Eval Rhythm 05/28/2013 AP/VP   Final  . Miscellaneous Comment 05/28/2013    Final                   Value:Pacemaker remote check. Device function reviewed. Impedance, sensing, auto capture thresholds consistent with previous measurements. Histograms appropriate for patient and level of activity. All other diagnostic data reviewed and is appropriate and                          stable for patient. Real time/magnet EGM shows appropriate sensing and capture. Pt in AF 2.2% of time. + Eliquis. No ventricular high rate episodes. Estimated longevity 9.5 years. Carelink 08-22-13 and ROV in October with [facility].     Assessment/Plan  1. HYPERTENSION controlled  2. Atrial fibrillation Intermittent. I advised her to resume Eliquis  3. COPD unchanged  4. HYPOTHYROIDISM Reduced levothyroxine to 100 mcg each day  5. Edema unchanged  6. Dyspnea on exertion unchanged  7. Pacemaker-mdt DDD Followed by Dr. Graciela Husbands  8. Unspecified venous (peripheral) insufficiency Contributes to edema   9. Enthesopathy of hip region resolved  10. Unspecified diastolic heart failure compensated  11. Cardiac pacemaker  in situ functioning  12. Shortness of breath unchanged  13. Spinal stenosis, lumbar region, without neurogenic claudication Improved. Still needs to use motorized wheelchair to travel any significant distance in her [facility].

## 2013-07-03 ENCOUNTER — Telehealth: Payer: Self-pay

## 2013-07-03 NOTE — Telephone Encounter (Signed)
Received faxed from [facility], appt date 08/10/13 at 11:10. Called patient to confirm appointment with her. She had received a call and forms from [facility].

## 2013-07-11 ENCOUNTER — Encounter: Payer: Self-pay | Admitting: Internal Medicine

## 2013-07-25 ENCOUNTER — Encounter: Payer: Self-pay | Admitting: Internal Medicine

## 2013-08-01 ENCOUNTER — Other Ambulatory Visit: Payer: Self-pay

## 2013-08-01 MED ORDER — HYDROCODONE-ACETAMINOPHEN 7.5-325 MG PO TABS: ORAL_TABLET | ORAL | Status: DC

## 2013-08-22 ENCOUNTER — Ambulatory Visit (INDEPENDENT_AMBULATORY_CARE_PROVIDER_SITE_OTHER): Payer: Medicare Other | Admitting: *Deleted

## 2013-08-22 ENCOUNTER — Encounter: Payer: Self-pay | Admitting: Internal Medicine

## 2013-08-22 DIAGNOSIS — R001 Bradycardia, unspecified: Secondary | ICD-10-CM

## 2013-08-22 DIAGNOSIS — I498 Other specified cardiac arrhythmias: Secondary | ICD-10-CM

## 2013-08-27 LAB — MDC_IDC_ENUM_SESS_TYPE_REMOTE
Battery Impedance: 128 Ohm
Battery Remaining Longevity: 109 mo
Battery Voltage: 2.79 V
Brady Statistic AP VP Percent: 60 %
Brady Statistic AP VS Percent: 0 %
Brady Statistic AS VP Percent: 40 %
Date Time Interrogation Session: 20150505141233
Lead Channel Impedance Value: 473 Ohm
Lead Channel Impedance Value: 504 Ohm
Lead Channel Pacing Threshold Amplitude: 0.75 V
Lead Channel Pacing Threshold Amplitude: 1.125 V
Lead Channel Pacing Threshold Pulse Width: 0.4 ms
Lead Channel Sensing Intrinsic Amplitude: 1.4 mV
Lead Channel Setting Pacing Amplitude: 2.25 V
Lead Channel Setting Pacing Amplitude: 2.5 V
Lead Channel Setting Pacing Pulse Width: 0.4 ms
MDC IDC MSMT LEADCHNL RV PACING THRESHOLD PULSEWIDTH: 0.4 ms
MDC IDC SET LEADCHNL RV SENSING SENSITIVITY: 2.8 mV
MDC IDC STAT BRADY AS VS PERCENT: 0 %

## 2013-09-01 NOTE — Progress Notes (Signed)
Remote pacemaker transmission.   

## 2013-09-15 ENCOUNTER — Encounter: Payer: Self-pay | Admitting: Cardiology

## 2013-09-26 ENCOUNTER — Other Ambulatory Visit: Payer: Self-pay

## 2013-09-26 MED ORDER — HYDROCODONE-ACETAMINOPHEN 7.5-325 MG PO TABS: ORAL_TABLET | ORAL | Status: DC

## 2013-10-09 ENCOUNTER — Other Ambulatory Visit: Payer: Self-pay | Admitting: *Deleted

## 2013-10-09 DIAGNOSIS — R609 Edema, unspecified: Secondary | ICD-10-CM

## 2013-10-09 MED ORDER — FUROSEMIDE 40 MG PO TABS: ORAL_TABLET | ORAL | Status: DC

## 2013-10-09 NOTE — Telephone Encounter (Signed)
[facility] 

## 2013-11-07 ENCOUNTER — Non-Acute Institutional Stay: Payer: Medicare Other | Admitting: Internal Medicine

## 2013-11-07 ENCOUNTER — Encounter: Payer: Self-pay | Admitting: Internal Medicine

## 2013-11-07 VITALS — BP 124/68 | HR 76 | Temp 98.0°F | Wt 137.0 lb

## 2013-11-07 DIAGNOSIS — J449 Chronic obstructive pulmonary disease, unspecified: Secondary | ICD-10-CM

## 2013-11-07 DIAGNOSIS — R609 Edema, unspecified: Secondary | ICD-10-CM

## 2013-11-07 DIAGNOSIS — I4891 Unspecified atrial fibrillation: Secondary | ICD-10-CM

## 2013-11-07 DIAGNOSIS — R0609 Other forms of dyspnea: Secondary | ICD-10-CM

## 2013-11-07 DIAGNOSIS — R0989 Other specified symptoms and signs involving the circulatory and respiratory systems: Secondary | ICD-10-CM

## 2013-11-07 DIAGNOSIS — I482 Chronic atrial fibrillation, unspecified: Secondary | ICD-10-CM

## 2013-11-07 DIAGNOSIS — I872 Venous insufficiency (chronic) (peripheral): Secondary | ICD-10-CM

## 2013-11-07 DIAGNOSIS — I5032 Chronic diastolic (congestive) heart failure: Secondary | ICD-10-CM

## 2013-11-07 DIAGNOSIS — N183 Chronic kidney disease, stage 3 unspecified: Secondary | ICD-10-CM | POA: Insufficient documentation

## 2013-11-07 DIAGNOSIS — I10 Essential (primary) hypertension: Secondary | ICD-10-CM

## 2013-11-07 DIAGNOSIS — E039 Hypothyroidism, unspecified: Secondary | ICD-10-CM

## 2013-11-07 MED ORDER — FUROSEMIDE 40 MG PO TABS: ORAL_TABLET | ORAL | Status: DC

## 2013-11-07 NOTE — Progress Notes (Signed)
Patient ID: Sabrina Bishop, female   DOB: December 11, 1916, 80 y.o.   MRN: [id number]    Location:  [facility]   Place of Service: Clinic (12)    Allergies  Allergen Reactions  . Adhesive [Tape] Rash    Chief Complaint  Patient presents with  . Leg Swelling    left on 11/03/13, better now  . Toe Pain    right toe and right heel hurt last night    HPI:  Edema: had significant edema about a week ago. Legs became very swollen and she was a little more short of breath than usual. Symptoms improved when she added more Lasix. Legs were uncomfortable when swollen, but not painful. She denies palpitations or chest pain.  Dyspnea on exertion: increased when leg swelling got worse.  DIASTOLIC HEART FAILURE, CHRONIC:diagnosed by Dr. Graciela Husbands a year ago.  HYPERTENSION: controlled  HYPOTHYROIDISM:  Compensated. On levothroxine.  COPD: chronic  Chronic atrial fibrillation: rate controlled  Unspecified venous (peripheral) insufficiency: known issue with edema, but the acute change she described would seem unusual.  Pain in the right toe: resolved. Medial side of the nail of the great toe has some peeling of skin, but is not tender.  Pain in the right heel: painful with palpation of the right foot anterior to the calcaneus.   Medications: Patient's Medications  New Prescriptions   No medications on file  Previous Medications   APIXABAN (ELIQUIS) 2.5 MG TABS TABLET    One twice daily for anticoagulation to prevent stroke   ATENOLOL (TENORMIN) 50 MG TABLET    Take 1 tablet (50 mg total) by mouth daily.   ATORVASTATIN (LIPITOR) 10 MG TABLET    Take 1 tablet (10 mg total) by mouth daily.   FUROSEMIDE (LASIX) 40 MG TABLET    Take 1 and 1/2 tablets (60mg ) by mouth once daily for diuretic.   HYDROCODONE-ACETAMINOPHEN (NORCO) 7.5-325 MG PER TABLET    Take one tablet every 4 hours as needed for pain   ISOSORBIDE MONONITRATE (IMDUR) 30 MG 24 HR TABLET    Take 1/2 tablet daily   LEVOTHYROXINE (SYNTHROID, LEVOTHROID) 100 MCG TABLET    One daily for thyroid supplement   LOSARTAN (COZAAR) 50 MG TABLET    Take 1 tablet (50 mg total) by mouth daily.   SPIRONOLACTONE (ALDACTONE) 25 MG TABLET    Take 1 tablet (25 mg total) by mouth daily.  Modified Medications   No medications on file  Discontinued Medications   No medications on file     Review of Systems  Constitutional: Positive for activity change and fatigue.  HENT: Positive for hearing loss and tinnitus. Negative for congestion and ear pain.   Eyes: Negative.   Respiratory: Positive for shortness of breath.   Cardiovascular: Positive for leg swelling. Negative for chest pain and palpitations.  Gastrointestinal: Negative for abdominal pain and abdominal distention.  Endocrine: Negative.   Genitourinary: Positive for frequency.  Musculoskeletal: Positive for arthralgias, back pain and gait problem. Negative for neck stiffness.       Poor balance  Skin:       Large keratoacanthoma left hand dorsum. Several patches of likely actinic keratoses at right upper arm and left cheek.  Neurological: Positive for dizziness and weakness. Negative for tremors and syncope.  Hematological: Negative.   Psychiatric/Behavioral: Negative for behavioral problems, confusion and agitation.    Filed Vitals:   11/07/13 1135  BP: 124/68  Pulse: 76  Temp: 98 F (36.7 C)  TempSrc: Oral  Weight: 137 lb (62.143 kg)  SpO2: 96%   Body mass index is 30.18 kg/(m^2).  Physical Exam  Constitutional: She is oriented to person, place, and time. No distress.  frail  HENT:  Head: Normocephalic and atraumatic.  Partial deafness.  Eyes: Pupils are equal, round, and reactive to light.  Neck: Normal range of motion. Neck supple. No JVD present. No tracheal deviation present. No thyromegaly present.  Cardiovascular: Normal rate, regular rhythm, normal heart sounds and intact distal pulses.  Exam reveals no gallop and no friction rub.   No  murmur heard. Pulmonary/Chest: Effort normal. No respiratory distress. She has no wheezes. She has rales. She exhibits no tenderness.  Abdominal: Bowel sounds are normal. She exhibits no distension and no mass. There is no tenderness.  Musculoskeletal: Normal range of motion. She exhibits edema. She exhibits no tenderness.  Using power chair and cane.  Lymphadenopathy:    She has no cervical adenopathy.  Neurological: She is alert and oriented to person, place, and time. No cranial nerve deficit.  Skin: Skin is warm and dry. No rash noted. No erythema. No pallor.  Senile ecchymoses. Generalized and widely spread breaking out on the face and legs.  Psychiatric: She has a normal mood and affect. Her behavior is normal. Thought content normal.     Labs reviewed: Clinical Support on 08/22/2013  Component Date Value Ref Range Status  . Date Time Interrogation Session 08/27/2013 [id number]   Final  . Pulse Generator Manufacturer 08/27/2013 Medtronic   Final  . Pulse Gen Model 08/27/2013 [id number] Jana HalfSensia   Final  . Pulse Gen Serial Number 08/27/2013 [id number]   Final  . RV Sense Sensitivity 08/27/2013 2.8   Final  . RA Pace Amplitude 08/27/2013 2.25   Final  . RV Pace PulseWidth 08/27/2013 0.4   Final  . RV Pace Amplitude 08/27/2013 2.5   Final  . RA Impedance 08/27/2013 504   Final  . RA Amplitude 08/27/2013 1.4   Final  . RA Pacing Amplitude 08/27/2013 1.125   Final  . RA Pacing PulseWidth 08/27/2013 0.4   Final  . RV IMPEDANCE 08/27/2013 473   Final  . RV Pacing Amplitude 08/27/2013 0.75   Final  . RV Pacing PulseWidth 08/27/2013 0.4   Final  . Battery Status 08/27/2013 Unknown   Final  . Battery Longevity 08/27/2013 109   Final  . Battery Voltage 08/27/2013 2.79   Final  . Battery Impedance 08/27/2013 128   Final  . Huston FoleyBrady AP VP Percent 08/27/2013 60   Final  . Huston FoleyBrady AS VP Percent 08/27/2013 40   Final  . Huston FoleyBrady AP VS Percent 08/27/2013 0   Final  . Huston FoleyBrady AS VS Percent 08/27/2013  0   Final  . Eval Rhythm 08/27/2013 As&Ap/Vp   Final  . Miscellaneous Comment 08/27/2013    Final                   Value:Pacemaker remote check. Device function reviewed. Impedance, sensing, auto capture thresholds consistent with previous measurements. Histograms appropriate for patient and level of activity. All other diagnostic data reviewed and is appropriate and                          stable for patient. Real time/magnet EGM shows appropriate sensing and capture. 2.1% mode switch + eliquis. No ventricular high rate episodes. Estimated longevity 2925yrs. Carelink 11/23/13 & ROV w/ GT 11/15.  Assessment/Plan  1. Edema Continue Lasix 80 mg qd. Watch for rising BUN and creatinine. -CMP  2. Dyspnea on exertion Multifactorial--COPD, AF, increased edema  3. DIASTOLIC HEART FAILURE, CHRONIC -2D Echo  4. HYPERTENSION controlled  5. HYPOTHYROIDISM compensated  6. COPD -Pulmonary consult. May need CXR and PFT.  7. Chronic atrial fibrillation Rate controlled  8. Unspecified venous (peripheral) insufficiency Contributor to edema, but not likely to related to recent acute worsening.  9. Rash -She has a scheduled appt at [facility].    Lab pending: CBC, CMP, TSH

## 2013-11-09 ENCOUNTER — Telehealth: Payer: Self-pay

## 2013-11-09 ENCOUNTER — Encounter: Payer: Self-pay | Admitting: *Deleted

## 2013-11-09 LAB — BASIC METABOLIC PANEL
BUN: 43 mg/dL — AB (ref 4–21)
Creatinine: 1.7 mg/dL — AB (ref 0.5–1.1)
Glucose: 103 mg/dL
Potassium: 4.1 mmol/L (ref 3.4–5.3)
Sodium: 138 mmol/L (ref 137–147)

## 2013-11-09 LAB — CBC AND DIFFERENTIAL
HCT: 36 % (ref 36–46)
Hemoglobin: 11.8 g/dL — AB (ref 12.0–16.0)
Platelets: 177 10*3/uL (ref 150–399)
WBC: 4.5 10^3/mL

## 2013-11-09 LAB — HEPATIC FUNCTION PANEL
ALK PHOS: 63 U/L (ref 25–125)
ALT: 41 U/L — AB (ref 7–35)
AST: 36 U/L — AB (ref 13–35)

## 2013-11-09 LAB — TSH: TSH: 1.6 u[IU]/mL (ref 0.41–5.90)

## 2013-11-09 NOTE — Telephone Encounter (Signed)
Called patient to make sure she has the dates and times of her appointments. She said she did. 2-D echo 11/23/13 at 11:30; Dr. Shelle Ironlance 12/07/13 at 11:30. Dr. Chilton SiGreen 01/02/14.

## 2013-11-21 ENCOUNTER — Other Ambulatory Visit (HOSPITAL_COMMUNITY): Payer: Medicare Other

## 2013-11-23 ENCOUNTER — Ambulatory Visit (INDEPENDENT_AMBULATORY_CARE_PROVIDER_SITE_OTHER): Payer: Medicare Other | Admitting: *Deleted

## 2013-11-23 ENCOUNTER — Telehealth: Payer: Self-pay | Admitting: Cardiology

## 2013-11-23 ENCOUNTER — Ambulatory Visit (HOSPITAL_COMMUNITY): Payer: Medicare Other | Attending: Cardiology | Admitting: Radiology

## 2013-11-23 ENCOUNTER — Telehealth: Payer: Self-pay | Admitting: Internal Medicine

## 2013-11-23 ENCOUNTER — Other Ambulatory Visit (HOSPITAL_COMMUNITY): Payer: Self-pay | Admitting: Internal Medicine

## 2013-11-23 DIAGNOSIS — R609 Edema, unspecified: Secondary | ICD-10-CM | POA: Insufficient documentation

## 2013-11-23 DIAGNOSIS — I079 Rheumatic tricuspid valve disease, unspecified: Secondary | ICD-10-CM | POA: Insufficient documentation

## 2013-11-23 DIAGNOSIS — I10 Essential (primary) hypertension: Secondary | ICD-10-CM | POA: Diagnosis not present

## 2013-11-23 DIAGNOSIS — I498 Other specified cardiac arrhythmias: Secondary | ICD-10-CM

## 2013-11-23 DIAGNOSIS — R0602 Shortness of breath: Secondary | ICD-10-CM | POA: Diagnosis not present

## 2013-11-23 DIAGNOSIS — I359 Nonrheumatic aortic valve disorder, unspecified: Secondary | ICD-10-CM | POA: Insufficient documentation

## 2013-11-23 DIAGNOSIS — J449 Chronic obstructive pulmonary disease, unspecified: Secondary | ICD-10-CM | POA: Insufficient documentation

## 2013-11-23 DIAGNOSIS — I872 Venous insufficiency (chronic) (peripheral): Secondary | ICD-10-CM | POA: Diagnosis not present

## 2013-11-23 DIAGNOSIS — I059 Rheumatic mitral valve disease, unspecified: Secondary | ICD-10-CM | POA: Insufficient documentation

## 2013-11-23 DIAGNOSIS — I517 Cardiomegaly: Secondary | ICD-10-CM | POA: Insufficient documentation

## 2013-11-23 DIAGNOSIS — J4489 Other specified chronic obstructive pulmonary disease: Secondary | ICD-10-CM | POA: Insufficient documentation

## 2013-11-23 DIAGNOSIS — I4891 Unspecified atrial fibrillation: Secondary | ICD-10-CM | POA: Insufficient documentation

## 2013-11-23 DIAGNOSIS — E039 Hypothyroidism, unspecified: Secondary | ICD-10-CM | POA: Insufficient documentation

## 2013-11-23 DIAGNOSIS — R0609 Other forms of dyspnea: Secondary | ICD-10-CM

## 2013-11-23 NOTE — Telephone Encounter (Signed)
LMOVM reminding pt to send remote transmission.   

## 2013-11-23 NOTE — Progress Notes (Signed)
Echocardiogram performed.  

## 2013-11-23 NOTE — Telephone Encounter (Signed)
New message     Patient is returning Barbara's call regarding trying to get her remote pacer check to go thru.

## 2013-11-24 ENCOUNTER — Encounter: Payer: Self-pay | Admitting: Internal Medicine

## 2013-11-24 NOTE — Progress Notes (Signed)
Remote pacemaker transmission.   

## 2013-11-24 NOTE — Telephone Encounter (Signed)
Confirmed with pt that we did receive this transmission.

## 2013-11-27 LAB — MDC_IDC_ENUM_SESS_TYPE_REMOTE
Battery Impedance: 153 Ohm
Battery Remaining Longevity: 105 mo
Battery Voltage: 2.79 V
Brady Statistic AP VP Percent: 60 %
Brady Statistic AP VS Percent: 0 %
Brady Statistic AS VP Percent: 40 %
Brady Statistic AS VS Percent: 0 %
Date Time Interrogation Session: 20150806195509
Lead Channel Impedance Value: 464 Ohm
Lead Channel Impedance Value: 519 Ohm
Lead Channel Pacing Threshold Amplitude: 0.625 V
Lead Channel Pacing Threshold Amplitude: 0.875 V
Lead Channel Pacing Threshold Pulse Width: 0.4 ms
Lead Channel Pacing Threshold Pulse Width: 0.4 ms
Lead Channel Sensing Intrinsic Amplitude: 0.7 mV
Lead Channel Setting Pacing Amplitude: 2 V
Lead Channel Setting Pacing Amplitude: 2.5 V
Lead Channel Setting Pacing Pulse Width: 0.4 ms
Lead Channel Setting Sensing Sensitivity: 2.8 mV

## 2013-12-01 ENCOUNTER — Telehealth: Payer: Self-pay | Admitting: Internal Medicine

## 2013-12-01 NOTE — Telephone Encounter (Signed)
I spoke with the pt and made her aware of preliminary results of Echo.  The pt would like this test reviewed ASAP when Dr Graciela HusbandsKlein returns and would like to know if her SOB is related to findings on Echo.  I will forward this message to Dory HornSherri Price to follow-up with the pt next week.

## 2013-12-01 NOTE — Telephone Encounter (Signed)
New message     Patient want echo results.  Please call after 11:30

## 2013-12-04 ENCOUNTER — Other Ambulatory Visit: Payer: Self-pay | Admitting: *Deleted

## 2013-12-04 MED ORDER — LOSARTAN POTASSIUM 50 MG PO TABS: 50.0000 mg | ORAL_TABLET | Freq: Every day | ORAL | Status: DC

## 2013-12-04 MED ORDER — ATORVASTATIN CALCIUM 10 MG PO TABS: 10.0000 mg | ORAL_TABLET | Freq: Every day | ORAL | Status: DC

## 2013-12-04 MED ORDER — SPIRONOLACTONE 25 MG PO TABS: 25.0000 mg | ORAL_TABLET | Freq: Every day | ORAL | Status: DC

## 2013-12-04 MED ORDER — ISOSORBIDE MONONITRATE ER 30 MG PO TB24: ORAL_TABLET | ORAL | Status: DC

## 2013-12-04 MED ORDER — ATENOLOL 50 MG PO TABS: 50.0000 mg | ORAL_TABLET | Freq: Every day | ORAL | Status: DC

## 2013-12-05 ENCOUNTER — Other Ambulatory Visit: Payer: Self-pay | Admitting: *Deleted

## 2013-12-05 MED ORDER — LOSARTAN POTASSIUM 50 MG PO TABS: ORAL_TABLET | ORAL | Status: DC

## 2013-12-05 MED ORDER — ATORVASTATIN CALCIUM 10 MG PO TABS: ORAL_TABLET | ORAL | Status: DC

## 2013-12-05 MED ORDER — SPIRONOLACTONE 25 MG PO TABS: ORAL_TABLET | ORAL | Status: DC

## 2013-12-05 MED ORDER — ATENOLOL 50 MG PO TABS: ORAL_TABLET | ORAL | Status: DC

## 2013-12-05 MED ORDER — ISOSORBIDE MONONITRATE ER 30 MG PO TB24: ORAL_TABLET | ORAL | Status: DC

## 2013-12-05 NOTE — Telephone Encounter (Signed)
[facility] 

## 2013-12-07 ENCOUNTER — Ambulatory Visit (INDEPENDENT_AMBULATORY_CARE_PROVIDER_SITE_OTHER)
Admission: RE | Admit: 2013-12-07 | Discharge: 2013-12-07 | Disposition: A | Discharge status: Home / Self Care | Payer: Medicare Other | Source: Ambulatory Visit | Attending: Pulmonary Disease | Admitting: Pulmonary Disease

## 2013-12-07 ENCOUNTER — Ambulatory Visit (INDEPENDENT_AMBULATORY_CARE_PROVIDER_SITE_OTHER): Payer: Medicare Other | Admitting: Pulmonary Disease

## 2013-12-07 ENCOUNTER — Encounter: Payer: Self-pay | Admitting: Pulmonary Disease

## 2013-12-07 ENCOUNTER — Encounter (INDEPENDENT_AMBULATORY_CARE_PROVIDER_SITE_OTHER): Payer: Self-pay

## 2013-12-07 VITALS — BP 126/76 | HR 65 | Temp 98.1°F | Ht 59.0 in | Wt 137.2 lb

## 2013-12-07 DIAGNOSIS — R0989 Other specified symptoms and signs involving the circulatory and respiratory systems: Secondary | ICD-10-CM

## 2013-12-07 DIAGNOSIS — R0609 Other forms of dyspnea: Secondary | ICD-10-CM

## 2013-12-07 NOTE — Progress Notes (Signed)
   Subjective:    Patient ID: Rodman PickleCatherine H Aday, female    DOB: 05-10-1916, 80 y.o.   MRN: [id number]  HPI The patient is a 80 year old female who I've been asked to see for dyspnea on exertion. She feels this has been going on for several years, but has been slowly progressive over time. It has gotten to the point that she will get winded just walking through her house or doing light housework. She can also get short of breath dressing, but does not have dyspnea at rest. The patient has never smoked, and denies any prior history of asthma. She has no cough or congestion, and does not produce mucus. She does have lower extremity edema for which she is on diuretics. She has multiple cardiac issues, including diastolic dysfunction and atrial fibrillation. A recent echo shows a normal ejection fraction, but mild to moderate pulmonary hypertension. She tells me that she has not had a recent chest x-ray.   Review of Systems  Constitutional: Negative for fever and unexpected weight change.  HENT: Positive for rhinorrhea. Negative for congestion, dental problem, ear pain, nosebleeds, postnasal drip, sinus pressure, sneezing, sore throat and trouble swallowing.   Eyes: Negative for redness and itching.  Respiratory: Positive for shortness of breath. Negative for cough, chest tightness and wheezing.   Cardiovascular: Negative for palpitations and leg swelling.  Gastrointestinal: Negative for nausea and vomiting.  Genitourinary: Negative for dysuria.  Musculoskeletal: Negative for joint swelling.  Skin: Negative for rash.  Neurological: Negative for headaches.  Hematological: Does not bruise/bleed easily.  Psychiatric/Behavioral: Negative for dysphoric mood. The patient is not nervous/anxious.        Objective:   Physical Exam Constitutional:  Overweight female, no acute distress  HENT:  Nares patent without discharge, mild septal deviation to the left  Oropharynx without exudate, palate and  uvula are normal  Eyes:  Perrla, eomi, no scleral icterus  Neck:  No JVD, no TMG  Cardiovascular:  Normal rate, regular rhythm, no rubs or gallops.  2/6 sem        Intact distal pulses but diminished.  Pulmonary :  Normal breath sounds, no stridor or respiratory distress   No rales, rhonchi, or wheezing  Abdominal:  Soft, nondistended, bowel sounds present.  No tenderness noted.   Musculoskeletal:  2+ lower extremity edema noted.  Lymph Nodes:  No cervical lymphadenopathy noted  Skin:  No cyanosis noted  Neurologic:  Alert, appropriate, moves all 4 extremities without obvious deficit.         Assessment & Plan:

## 2013-12-07 NOTE — Assessment & Plan Note (Signed)
The patient has significant dyspnea with even mild exertional activities, but does not have airflow obstruction on spirometry nor desaturation with walking. I suspect this is a combination of her deconditioning and muscle weakness, as well as her underlying heart disease. She clearly has chronic lower extremity edema, and I do not know if it is safe to push more aggressive treatment of her diastolic dysfunction. I will leave that to her cardiologist and primary care physician. She also has some spine deformity, and this can also contribute to dyspnea as well. From my standpoint, I do not see any benefit of trying a bronchodilator, or adding oxygen with exertion. I will check a chest x-ray today for completeness.

## 2013-12-07 NOTE — Patient Instructions (Signed)
Continue to stay as active as possible. The good news is that you do not have copd, and your oxygen level is maintained with activity.  I suspect your breathing issue is related to your strength/conditioning, as well as your underlying cardiac disease. Will check chest xray today for completeness. Would be happy to see you again as needed.

## 2013-12-12 ENCOUNTER — Encounter: Payer: Self-pay | Admitting: Cardiology

## 2013-12-19 ENCOUNTER — Encounter: Payer: Self-pay | Admitting: Internal Medicine

## 2013-12-28 LAB — TSH: TSH: 2.01 u[IU]/mL (ref 0.41–5.90)

## 2013-12-28 LAB — BASIC METABOLIC PANEL
BUN: 43 mg/dL — AB (ref 4–21)
CREATININE: 1.6 mg/dL — AB (ref 0.5–1.1)
Glucose: 94 mg/dL
Potassium: 4.2 mmol/L (ref 3.4–5.3)
SODIUM: 136 mmol/L — AB (ref 137–147)

## 2014-01-02 ENCOUNTER — Non-Acute Institutional Stay: Payer: Medicare Other | Admitting: Internal Medicine

## 2014-01-02 ENCOUNTER — Encounter: Payer: Self-pay | Admitting: Internal Medicine

## 2014-01-02 VITALS — BP 120/60 | HR 80 | Temp 97.5°F | Wt 134.5 lb

## 2014-01-02 DIAGNOSIS — I10 Essential (primary) hypertension: Secondary | ICD-10-CM

## 2014-01-02 DIAGNOSIS — E039 Hypothyroidism, unspecified: Secondary | ICD-10-CM

## 2014-01-02 DIAGNOSIS — I482 Chronic atrial fibrillation, unspecified: Secondary | ICD-10-CM

## 2014-01-02 DIAGNOSIS — I4891 Unspecified atrial fibrillation: Secondary | ICD-10-CM

## 2014-01-02 DIAGNOSIS — I5032 Chronic diastolic (congestive) heart failure: Secondary | ICD-10-CM

## 2014-01-02 NOTE — Progress Notes (Signed)
Patient ID: NYLA CREASON, female   DOB: 1916-07-28, 80 y.o.   MRN: [id number]    Location:  [facility]   Place of Service: Clinic (12)    Allergies  Allergen Reactions  . Adhesive [Tape] Rash    Chief Complaint  Patient presents with  . Medical Management of Chronic Issues    CHF, blood pressure, thyroid. With son Brett Canales  . Shoulder Pain    righ upper for 3 weeks  . toes pain    red and swollen worse at night.    HPI:  DIASTOLIC HEART FAILURE, CHRONIC: compensated  Chronic atrial fibrillation: stable and rate controlled  Essential hypertension: controlled  Hypothyroidism, unspecified hypothyroidism type: last TSH normal    Medications: Patient's Medications  New Prescriptions   No medications on file  Previous Medications   APIXABAN (ELIQUIS) 2.5 MG TABS TABLET    One twice daily for anticoagulation to prevent stroke   ATENOLOL (TENORMIN) 50 MG TABLET    Take one tablet by mouth once daily for blood pressure   ATORVASTATIN (LIPITOR) 10 MG TABLET    Take one tablet by mouth once daily for cholesterol   FUROSEMIDE (LASIX) 40 MG TABLET    Take 2 tablets ( ) by mouth once daily for diuretic.   HYDROCODONE-ACETAMINOPHEN (NORCO) 7.5-325 MG PER TABLET    Take one tablet every 4 hours as needed for pain   ISOSORBIDE MONONITRATE (IMDUR) 30 MG 24 HR TABLET    Take 1/2 tablet by mouth once daily to prevent angina   LEVOTHYROXINE (SYNTHROID, LEVOTHROID) 100 MCG TABLET    One daily for thyroid supplement   LOSARTAN (COZAAR) 50 MG TABLET    Take one tablet by mouth once daily for blood pressure   SPIRONOLACTONE (ALDACTONE) 25 MG TABLET    Take one tablet by mouth once daily for edema  Modified Medications   No medications on file  Discontinued Medications   No medications on file     Review of Systems  Constitutional: Positive for activity change and fatigue.  HENT: Positive for hearing loss and tinnitus. Negative for congestion and ear pain.   Eyes:  Negative.   Respiratory: Positive for shortness of breath.   Cardiovascular: Positive for leg swelling. Negative for chest pain and palpitations.  Gastrointestinal: Negative for abdominal pain and abdominal distention.  Endocrine: Negative.   Genitourinary: Positive for frequency.  Musculoskeletal: Positive for arthralgias, back pain and gait problem. Negative for neck stiffness.       Poor balance  Skin:       Large keratoacanthoma left hand dorsum. Several patches of likely actinic keratoses at right upper arm and left cheek.  Neurological: Positive for weakness. Negative for dizziness, tremors and syncope.  Hematological: Negative.   Psychiatric/Behavioral: Negative for behavioral problems, confusion and agitation.    Filed Vitals:   01/02/14 1039  BP: 120/60  Pulse: 80  Temp: 97.5 F (36.4 C)  TempSrc: Oral  Weight: 134 lb 8 oz (61.009 kg)   Body mass index is 27.15 kg/(m^2).  Physical Exam  Constitutional: She is oriented to person, place, and time. No distress.  frail  HENT:  Head: Normocephalic and atraumatic.  Partial deafness.  Eyes: Pupils are equal, round, and reactive to light.  Neck: Normal range of motion. Neck supple. No JVD present. No tracheal deviation present. No thyromegaly present.  Cardiovascular: Normal rate, regular rhythm, normal heart sounds and intact distal pulses.  Exam reveals no gallop and no friction rub.  No murmur heard. Pulmonary/Chest: Effort normal. No respiratory distress. She has no wheezes. She has rales. She exhibits no tenderness.  Abdominal: Bowel sounds are normal. She exhibits no distension and no mass. There is no tenderness.  Musculoskeletal: Normal range of motion. She exhibits edema. She exhibits no tenderness.  Using power chair and cane.  Lymphadenopathy:    She has no cervical adenopathy.  Neurological: She is alert and oriented to person, place, and time. No cranial nerve deficit.  Skin: Skin is warm and dry. No rash  noted. No erythema. No pallor.  Senile ecchymoses.  Psychiatric: She has a normal mood and affect. Her behavior is normal. Thought content normal.     Labs reviewed: Nursing Home on 01/02/2014  Component Date Value Ref Range Status  . Glucose 12/28/2013 94   Final  . BUN 12/28/2013 43* 4 - 21 mg/dL Final  . Creatinine 16/01/9603 1.6* 0.5 - 1.1 mg/dL Final  . Potassium 54/12/8117 4.2  3.4 - 5.3 mmol/L Final  . Sodium 12/28/2013 136* 137 - 147 mmol/L Final  . TSH 12/28/2013 2.01  0.41 - 5.90 uIU/mL Final  Clinical Support on 11/23/2013  Component Date Value Ref Range Status  . Date Time Interrogation Session 11/27/2013 [id number]   Final  . Pulse Generator Manufacturer 11/27/2013 Med[employer]onic   Final  . Pulse Gen Model 11/27/2013 [id number] Jana Half   Final  . Pulse Gen Serial Number 11/27/2013 [id number]   Final  . RV Sense Sensitivity 11/27/2013 2.8   Final  . RA Pace Amplitude 11/27/2013 2   Final  . RV Pace PulseWidth 11/27/2013 0.4   Final  . RV Pace Amplitude 11/27/2013 2.5   Final  . RA Impedance 11/27/2013 519   Final  . RA Amplitude 11/27/2013 0.7   Final  . RA Pacing Amplitude 11/27/2013 0.875   Final  . RA Pacing PulseWidth 11/27/2013 0.4   Final  . RV IMPEDANCE 11/27/2013 464   Final  . RV Pacing Amplitude 11/27/2013 0.625   Final  . RV Pacing PulseWidth 11/27/2013 0.4   Final  . Battery Status 11/27/2013 Unknown   Final  . Battery Longevity 11/27/2013 105   Final  . Battery Voltage 11/27/2013 2.79   Final  . Battery Impedance 11/27/2013 153   Final  . Brady AP VP Percent 11/27/2013 60   Final  . Huston Foley AS VP Percent 11/27/2013 40   Final  . [place]on Foley AP VS Percent 11/27/2013 0   Final  . [place]on Foley AS VS Percent 11/27/2013 0   Final  . Eval Rhythm 11/27/2013 A-fib   Final  . Miscellaneous Comment 11/27/2013    Final                   Value:Pacemaker remote check. Device function reviewed. Impedance, sensing, auto capture thresholds consistent with previous measurements.  Histograms appropriate for patient and level of activity. All other diagnostic data reviewed and is appropriate and                          stable for patient. Real time/magnet EGM shows appropriate sensing and capture. A-fib since 09/11/13, + eliquis. Total burden 25.8%.   No ventricular high rate episodes. Estimated longevity 8.5 years. Plan to follow in 3 months remotely, to see in office                          annually.  ROV in November  with Dr. Graciela Husbands.  Abstract on 11/09/2013  Component Date Value Ref Range Status  . Hemoglobin 11/09/2013 11.8* 12.0 - 16.0 g/dL Final  . HCT 78/29/5621 36  36 - 46 % Final  . Platelets 11/09/2013 177  150 - 399 K/L Final  . WBC 11/09/2013 4.5   Final  . Glucose 11/09/2013 103   Final  . BUN 11/09/2013 43* 4 - 21 mg/dL Final  . Creatinine 30/86/5784 1.7* 0.5 - 1.1 mg/dL Final  . Potassium 69/62/9528 4.1  3.4 - 5.3 mmol/L Final  . Sodium 11/09/2013 138  137 - 147 mmol/L Final  . Alkaline Phosphatase 11/09/2013 63  25 - 125 U/L Final  . ALT 11/09/2013 41* 7 - 35 U/L Final  . AST 11/09/2013 36* 13 - 35 U/L Final  . TSH 11/09/2013 1.60  0.41 - 5.90 uIU/mL Final    Dg Chest 2 View  12/07/2013   CLINICAL DATA:  Short of breath.  Hypertension.  EXAM: CHEST  2 VIEW  COMPARISON:  11/28/2009  FINDINGS: Cardiac silhouette is normal in size. Aorta is mildly tortuous. No mediastinal or hilar masses or evidence of adenopathy.  Left anterior chest wall sequential pacemaker is stable and well positioned.  Lungs are clear.  No pleural effusion or pneumothorax.  Bony thorax is demineralized but intact.  IMPRESSION: No acute cardiopulmonary disease.  No change from the prior study.   Electronically Signed   By: Amie Portland M.D.   On: 12/07/2013 13:15     Assessment/Plan   1. DIASTOLIC HEART FAILURE, CHRONIC Continue current medications  2. Chronic atrial fibrillation Continue current meds  3. Essential hypertension Stable/ controlled  4. Hypothyroidism,  unspecified hypothyroidism type Compensated

## 2014-01-03 ENCOUNTER — Telehealth: Payer: Self-pay | Admitting: Internal Medicine

## 2014-01-03 NOTE — Telephone Encounter (Signed)
Spoke with Brett Canales (pt's son), per her permission/request (I called her first and she told me to call her son). He explains that mom has been to her PCP and pulmonologist recently and they have conflicting diagnoses from each and would like clarification from Dr. Graciela Husbands. He tells me that she saw Dr. Shelle Iron a few weeks ago and CHF was mentioned as SOB cause, because he didn't see anything from pulmonology standpoint. He further explains that she went to her PCP yesterday and he sees no signs of CHF. They would like Dr. Odessa Fleming opinion on this and wants clarification as to if pt has CHF or not. I asked about swelling and he explains it is only in bilateral legs/feet. Patient is currently on 2 diuretics. I explained that Dr. Graciela Husbands is out of the office until next week and we would address questions then. He is agreeable to this plan.

## 2014-01-03 NOTE — Telephone Encounter (Signed)
° °  Pt has questions regarding his mothers Echo, please call and advise @ [phone].

## 2014-01-08 NOTE — Telephone Encounter (Signed)
Spoke with patient and her son (per her request) Pt still having SOB issues, weakness, not being able to walk far at all. Pt scheduled for 11/18 - I canceled and moved her to 9/23 with Dr. Graciela Husbands.  She and her son are agreeable to this.

## 2014-01-08 NOTE — Telephone Encounter (Signed)
Echo shows normal systolic function We havent seen  Her in a year   We would be glad to see her [facility])

## 2014-01-10 ENCOUNTER — Encounter: Payer: Self-pay | Admitting: Internal Medicine

## 2014-01-10 ENCOUNTER — Ambulatory Visit (INDEPENDENT_AMBULATORY_CARE_PROVIDER_SITE_OTHER): Payer: Medicare Other | Admitting: Internal Medicine

## 2014-01-10 VITALS — BP 118/54 | HR 67 | Ht 60.0 in | Wt 131.2 lb

## 2014-01-10 DIAGNOSIS — I4819 Other persistent atrial fibrillation: Secondary | ICD-10-CM

## 2014-01-10 DIAGNOSIS — I480 Paroxysmal atrial fibrillation: Secondary | ICD-10-CM

## 2014-01-10 DIAGNOSIS — I4891 Unspecified atrial fibrillation: Secondary | ICD-10-CM

## 2014-01-10 DIAGNOSIS — I5032 Chronic diastolic (congestive) heart failure: Secondary | ICD-10-CM

## 2014-01-10 DIAGNOSIS — Z950 Presence of cardiac pacemaker: Secondary | ICD-10-CM

## 2014-01-10 DIAGNOSIS — I442 Atrioventricular block, complete: Secondary | ICD-10-CM

## 2014-01-10 DIAGNOSIS — R0602 Shortness of breath: Secondary | ICD-10-CM

## 2014-01-10 LAB — MDC_IDC_ENUM_SESS_TYPE_INCLINIC
Battery Impedance: 153 Ohm
Battery Remaining Longevity: 8.5
Battery Voltage: 2.78 V
Brady Statistic AP VP Percent: 59.6 %
Brady Statistic AP VS Percent: 0.1 % — CL
Brady Statistic AS VP Percent: 40.4 %
Lead Channel Setting Pacing Amplitude: 2 V
Lead Channel Setting Pacing Amplitude: 2.5 V
Lead Channel Setting Pacing Pulse Width: 0.4 ms
Lead Channel Setting Sensing Sensitivity: 2.8 mV
MDC IDC MSMT LEADCHNL RA IMPEDANCE VALUE: 518 Ohm
MDC IDC MSMT LEADCHNL RA SENSING INTR AMPL: 0.25 mV
MDC IDC MSMT LEADCHNL RV IMPEDANCE VALUE: 464 Ohm
MDC IDC MSMT LEADCHNL RV PACING THRESHOLD AMPLITUDE: 0.75 V
MDC IDC MSMT LEADCHNL RV PACING THRESHOLD PULSEWIDTH: 0.4 ms
MDC IDC STAT BRADY AS VS PERCENT: 0.1 % — AB

## 2014-01-10 LAB — CBC WITH DIFFERENTIAL/PLATELET
BASOS ABS: 0 10*3/uL (ref 0.0–0.1)
Basophils Relative: 0.1 % (ref 0.0–3.0)
EOS ABS: 0 10*3/uL (ref 0.0–0.7)
EOS PCT: 0 % (ref 0.0–5.0)
HCT: 29.9 % — ABNORMAL LOW (ref 36.0–46.0)
Hemoglobin: 10 g/dL — ABNORMAL LOW (ref 12.0–15.0)
Lymphocytes Relative: 13.8 % (ref 12.0–46.0)
Lymphs Abs: 0.9 10*3/uL (ref 0.7–4.0)
MCHC: 33.6 g/dL (ref 30.0–36.0)
MCV: 92.4 fl (ref 78.0–100.0)
MONO ABS: 0.9 10*3/uL (ref 0.1–1.0)
Monocytes Relative: 14.4 % — ABNORMAL HIGH (ref 3.0–12.0)
NEUTROS PCT: 71.7 % (ref 43.0–77.0)
Neutro Abs: 4.6 10*3/uL (ref 1.4–7.7)
Platelets: 185 10*3/uL (ref 150.0–400.0)
RBC: 3.23 Mil/uL — ABNORMAL LOW (ref 3.87–5.11)
RDW: 14.2 % (ref 11.5–15.5)
WBC: 6.4 10*3/uL (ref 4.0–10.5)

## 2014-01-10 LAB — COMPREHENSIVE METABOLIC PANEL
ALBUMIN: 3.7 g/dL (ref 3.5–5.2)
ALK PHOS: 54 U/L (ref 39–117)
ALT: 15 U/L (ref 0–35)
AST: 26 U/L (ref 0–37)
BUN: 54 mg/dL — ABNORMAL HIGH (ref 6–23)
CO2: 26 mEq/L (ref 19–32)
Calcium: 9.1 mg/dL (ref 8.4–10.5)
Chloride: 98 mEq/L (ref 96–112)
Creatinine, Ser: 2.3 mg/dL — ABNORMAL HIGH (ref 0.4–1.2)
GFR: 21.25 mL/min — ABNORMAL LOW (ref >60.00)
GLUCOSE: 111 mg/dL — AB (ref 70–99)
Potassium: 4.5 mEq/L (ref 3.5–5.1)
SODIUM: 133 meq/L — AB (ref 135–145)
TOTAL PROTEIN: 6.7 g/dL (ref 6.0–8.3)
Total Bilirubin: 0.8 mg/dL (ref 0.2–1.2)

## 2014-01-10 LAB — BRAIN NATRIURETIC PEPTIDE: Pro B Natriuretic peptide (BNP): 449 pg/mL — ABNORMAL HIGH (ref 0.0–100.0)

## 2014-01-10 NOTE — Patient Instructions (Addendum)
Labs today: BNP, CMET, CBCD  Your physician recommends that you continue on your current medications as directed. Please refer to the Current Medication list given to you today.  Remote monitoring is used to monitor your pacemaker from home. This monitoring reduces the number of office visits required to check your device to one time per year. It allows [place] to keep an eye on the functioning of your device to ensure it is working properly. You are scheduled for a device check from home on 04-16-2014. You may send your transmission at any time that day. If you have a wireless device, the transmission will be sent automatically. After your physician reviews your transmission, you will receive a postcard with your next transmission date.  Your physician recommends that you schedule a follow-up appointment in: 12 months with Dr.Klein

## 2014-01-10 NOTE — Progress Notes (Signed)
skr      Patient Care Team: Kimber Relic, MD as PCP - General (Internal Medicine) [facility] Duke Salvia, MD as Consulting Physician (Cardiology) Eugenia Mcalpine, MD as Consulting Physician (Orthopedic Surgery) Holli Humbles, MD as Consulting Physician (Ophthalmology)   HPI  Sabrina Bishop is a 80 y.o. female Seen in followup for complete heart block. She has sinus node dysfunction also. She is status post pacemaker implantation with generator replacement January 14   She is seen today because of concerns about progressive edema dyspnea. Functional status over the last 3-4 months. In the last week there has been progressive and significant anorexia;  this has been concurrent with interval improvement in her edema.   She is short of breath at about 10 steps.  Echo 8/15 65% with moderate MR and TR with mod pulm HTN    Past Medical History  Diagnosis Date  . Hypothyroidism   . OA (osteoarthritis)   . HTN (hypertension)   . Diastolic dysfunction   . AV BLOCK, COMPLETE 03/26/2009    Qualifier: Diagnosis of  By: Graciela Husbands, MD, Ruthann Cancer Ty Hilts   . Pacemaker-mdt DDD 12/16/2010  . Keratoconus, unspecified   . Disturbance of salivary secretion   . GERD (gastroesophageal reflux disease)   . Osteoarthrosis, unspecified whether generalized or localized, unspecified site   . Pain in joint, ankle and foot   . Spinal stenosis, lumbar region, without neurogenic claudication   . Lumbago   . Trigger finger (acquired)   . Abnormality of gait   . Edema   . Dysphagia, unspecified(787.20)   . Seborrheic keratosis 05/2012  . Other atopic dermatitis and related conditions 03/2012  . Unspecified diastolic heart failure 02/16/2012  . Mixed hyperlipidemia 08/2011  . Chronic kidney disease, stage II (mild) 08/2011  . Hypoxemia 08/2011  . Enthesopathy of hip region 01/2011  . Keratoconjunctivitis 2011  . Atrial fibrillation 02/15/2013  . Unspecified venous (peripheral)  insufficiency 2010  . Pain in joint, shoulder region 2015    left  . Keratoacanthoma of hand 2015    left  . Actinic keratoses 2015    right arm and left cheek    Past Surgical History  Procedure Laterality Date  . Total knee arthroplasty  2005    left  . Abdominal hysterectomy  1950  . Pacemaker generator change  04/2012    Dr. Graciela Husbands  . Pacemaker placement  2004  . Total knee arthroplasty  1995    right  . Cataract extraction w/ intraocular lens implant  2012    right    Current Outpatient Prescriptions  Medication Sig Dispense Refill  . apixaban (ELIQUIS) 2.5 MG TABS tablet One twice daily for anticoagulation to prevent stroke  180 tablet  3  . atenolol (TENORMIN) 50 MG tablet Take one tablet by mouth once daily for blood pressure  90 tablet  3  . atorvastatin (LIPITOR) 10 MG tablet Take one tablet by mouth once daily for cholesterol  90 tablet  3  . furosemide (LASIX) 40 MG tablet Take 2 tablets ( ) by mouth once daily for diuretic.  180 tablet  3  . HYDROcodone-acetaminophen (NORCO) 7.5-325 MG per tablet Take one tablet every 4 hours as needed for pain  180 tablet  0  . isosorbide mononitrate (IMDUR) 30 MG 24 hr tablet Take 1/2 tablet by mouth once daily to prevent angina  45 tablet  3  . levothyroxine (SYNTHROID, LEVOTHROID) 100 MCG tablet One daily for thyroid supplement  90 tablet  3  . losartan (COZAAR) 50 MG tablet Take one tablet by mouth once daily for blood pressure  90 tablet  3  . spironolactone (ALDACTONE) 25 MG tablet Take one tablet by mouth once daily for edema  90 tablet  3   No current facility-administered medications for this visit.    Allergies  Allergen Reactions  . Adhesive [Tape] Rash    Review of Systems negative except from HPI and PMH  Physical Exam BP 118/54  Pulse 67  Ht 5' (1.524 m)  Wt 131 lb 3.2 oz (59.512 kg)  BMI 25.62 kg/m2 Well developed and well nourished in no acute distress HENT normal E scleral and icterus clear Neck  Supple JVP 8-9 cm;+HJR  carotids brisk and full Clear to ausculation Device pocket well healed; without hematoma or erythema.  There is no tethering regular rate and rhythm  no murmurs gallops or rub Soft with active bowel sounds No clubbing cyanosis 2+ Edema Alert and oriented, grossly normal motor and sensory function Skin Warm and Dry  ECG demonstrates and atrial fibrillation Pacing with transition in lead V3 from upright    Assessment and  Plan  HFpEF  Renal insufficiency gr 5 with a creatinine clearance of about 17  Atrial fibrillation-persistent  Anorexia question right heart failure  The patient had progressive dyspnea of functional impairment and edema which is recently improved the context of worsening anorexia. This last finding the concern that is not all right heart failure as I would expect resolution of the edema to otherwise be associated with improvement in her appetite.  Blood work over the last couple of months has shown moderate renal insufficiency with a creatinine clearance as noted above; and concern that there has been progressive renal insufficiency   we'll check a metabolic profile as well as liver function tests as they also were abnormal in July albeit only modestly.  Discussed the possibility of cardioversion in this a try and restore sinus rhythm as atrial fibrillation certainly could be contributing to her impaired functional status. We will defer this decision to blood work

## 2014-01-11 ENCOUNTER — Telehealth: Payer: Self-pay | Admitting: *Deleted

## 2014-01-11 NOTE — Telephone Encounter (Signed)
Call from Dr. Graciela Husbands: Rec pt stop diuretics due to worsening kidney function PCP will call pt and arrange follow up/plan. Informed patient who verbalizes understanding and asked me to contact her son, Brett Canales. Informed Brett Canales, who verbalizes understanding. He will follow up with Dr. Paralee Cancel office if pt does not hear from them today.

## 2014-01-11 NOTE — Telephone Encounter (Signed)
Message copied by Lendon Ka on Thu Jan 11, 2014 12:41 PM ------      Message from: Levi Aland      Created: Thu Jan 11, 2014 12:21 PM                   ----- Message -----         From: [facility] Interface         Sent: 01/10/2014   4:49 PM           To: Duke Salvia, MD             ------

## 2014-01-12 ENCOUNTER — Telehealth: Payer: Self-pay | Admitting: Internal Medicine

## 2014-01-12 NOTE — Telephone Encounter (Signed)
Requesting lab to be re-drawn for PCP appt next Tuesday. Also wanting UA. Advised Dr. Graciela Husbands not in the office until next week. Advised to call PCP office to review request. Pt's son verbalized understanding.

## 2014-01-12 NOTE — Telephone Encounter (Signed)
New message ° ° ° ° °Want test results °

## 2014-01-15 ENCOUNTER — Telehealth: Payer: Self-pay | Admitting: Internal Medicine

## 2014-01-15 NOTE — Telephone Encounter (Signed)
Patient asking daughter-in-law to ask: Wanting to know if pt needs her device checked before PCP office visit tomorrow - advised that she does not need it checked prior to that and that device was checked earlier this month. I confirmed that nothing "new" has occurred.  She verbalized understanding.

## 2014-01-15 NOTE — Telephone Encounter (Signed)
New problem   Pt's daughter need to speak to you concerning pt's appt w/her pcp doctor tomorrow.

## 2014-01-16 ENCOUNTER — Encounter: Payer: Self-pay | Admitting: Internal Medicine

## 2014-01-16 ENCOUNTER — Non-Acute Institutional Stay: Payer: Medicare Other | Admitting: Internal Medicine

## 2014-01-16 VITALS — BP 118/62 | HR 64 | Temp 97.7°F | Wt 135.0 lb

## 2014-01-16 DIAGNOSIS — N183 Chronic kidney disease, stage 3 unspecified: Secondary | ICD-10-CM

## 2014-01-16 DIAGNOSIS — I5032 Chronic diastolic (congestive) heart failure: Secondary | ICD-10-CM

## 2014-01-16 DIAGNOSIS — I480 Paroxysmal atrial fibrillation: Secondary | ICD-10-CM

## 2014-01-16 DIAGNOSIS — I10 Essential (primary) hypertension: Secondary | ICD-10-CM

## 2014-01-16 DIAGNOSIS — D649 Anemia, unspecified: Secondary | ICD-10-CM

## 2014-01-16 DIAGNOSIS — R0609 Other forms of dyspnea: Secondary | ICD-10-CM

## 2014-01-16 DIAGNOSIS — M48061 Spinal stenosis, lumbar region without neurogenic claudication: Secondary | ICD-10-CM

## 2014-01-16 DIAGNOSIS — R0989 Other specified symptoms and signs involving the circulatory and respiratory systems: Secondary | ICD-10-CM

## 2014-01-16 DIAGNOSIS — R197 Diarrhea, unspecified: Secondary | ICD-10-CM

## 2014-01-16 DIAGNOSIS — I4891 Unspecified atrial fibrillation: Secondary | ICD-10-CM

## 2014-01-18 LAB — BASIC METABOLIC PANEL
BUN: 60 mg/dL — AB (ref 4–21)
Creatinine: 1.8 mg/dL — AB (ref 0.5–1.1)
POTASSIUM: 5.6 mmol/L — AB (ref 3.4–5.3)
Sodium: 133 mmol/L — AB (ref 137–147)

## 2014-01-19 ENCOUNTER — Telehealth: Payer: Self-pay | Admitting: *Deleted

## 2014-01-19 ENCOUNTER — Inpatient Hospital Stay (HOSPITAL_COMMUNITY)
Admission: EM | Admit: 2014-01-19 | Discharge: 2014-01-23 | DRG: 369 | Disposition: A | Discharge status: Skilled Nursing Facility | Payer: Medicare Other | Attending: Internal Medicine | Admitting: Internal Medicine

## 2014-01-19 ENCOUNTER — Encounter: Payer: Self-pay | Admitting: *Deleted

## 2014-01-19 ENCOUNTER — Encounter: Payer: Self-pay | Admitting: Internal Medicine

## 2014-01-19 ENCOUNTER — Emergency Department (HOSPITAL_COMMUNITY): Payer: Medicare Other

## 2014-01-19 ENCOUNTER — Encounter (HOSPITAL_COMMUNITY): Payer: Self-pay | Admitting: Emergency Medicine

## 2014-01-19 DIAGNOSIS — N184 Chronic kidney disease, stage 4 (severe): Secondary | ICD-10-CM

## 2014-01-19 DIAGNOSIS — D500 Iron deficiency anemia secondary to blood loss (chronic): Secondary | ICD-10-CM

## 2014-01-19 DIAGNOSIS — E039 Hypothyroidism, unspecified: Secondary | ICD-10-CM | POA: Diagnosis present

## 2014-01-19 DIAGNOSIS — E871 Hypo-osmolality and hyponatremia: Secondary | ICD-10-CM | POA: Diagnosis present

## 2014-01-19 DIAGNOSIS — K259 Gastric ulcer, unspecified as acute or chronic, without hemorrhage or perforation: Secondary | ICD-10-CM | POA: Diagnosis present

## 2014-01-19 DIAGNOSIS — I5032 Chronic diastolic (congestive) heart failure: Secondary | ICD-10-CM | POA: Diagnosis present

## 2014-01-19 DIAGNOSIS — I129 Hypertensive chronic kidney disease with stage 1 through stage 4 chronic kidney disease, or unspecified chronic kidney disease: Secondary | ICD-10-CM | POA: Diagnosis present

## 2014-01-19 DIAGNOSIS — Z96653 Presence of artificial knee joint, bilateral: Secondary | ICD-10-CM | POA: Diagnosis present

## 2014-01-19 DIAGNOSIS — Z7901 Long term (current) use of anticoagulants: Secondary | ICD-10-CM

## 2014-01-19 DIAGNOSIS — E875 Hyperkalemia: Secondary | ICD-10-CM | POA: Diagnosis present

## 2014-01-19 DIAGNOSIS — K922 Gastrointestinal hemorrhage, unspecified: Secondary | ICD-10-CM | POA: Diagnosis present

## 2014-01-19 DIAGNOSIS — K226 Gastro-esophageal laceration-hemorrhage syndrome: Principal | ICD-10-CM | POA: Diagnosis present

## 2014-01-19 DIAGNOSIS — I482 Chronic atrial fibrillation, unspecified: Secondary | ICD-10-CM

## 2014-01-19 DIAGNOSIS — D62 Acute posthemorrhagic anemia: Secondary | ICD-10-CM | POA: Diagnosis present

## 2014-01-19 DIAGNOSIS — Z79899 Other long term (current) drug therapy: Secondary | ICD-10-CM | POA: Diagnosis not present

## 2014-01-19 DIAGNOSIS — R531 Weakness: Secondary | ICD-10-CM | POA: Diagnosis present

## 2014-01-19 DIAGNOSIS — Z950 Presence of cardiac pacemaker: Secondary | ICD-10-CM | POA: Diagnosis not present

## 2014-01-19 DIAGNOSIS — I509 Heart failure, unspecified: Secondary | ICD-10-CM

## 2014-01-19 DIAGNOSIS — N183 Chronic kidney disease, stage 3 unspecified: Secondary | ICD-10-CM

## 2014-01-19 DIAGNOSIS — K921 Melena: Secondary | ICD-10-CM

## 2014-01-19 DIAGNOSIS — K269 Duodenal ulcer, unspecified as acute or chronic, without hemorrhage or perforation: Secondary | ICD-10-CM | POA: Diagnosis present

## 2014-01-19 DIAGNOSIS — K254 Chronic or unspecified gastric ulcer with hemorrhage: Secondary | ICD-10-CM

## 2014-01-19 DIAGNOSIS — K210 Gastro-esophageal reflux disease with esophagitis: Secondary | ICD-10-CM | POA: Diagnosis present

## 2014-01-19 DIAGNOSIS — I442 Atrioventricular block, complete: Secondary | ICD-10-CM

## 2014-01-19 DIAGNOSIS — K449 Diaphragmatic hernia without obstruction or gangrene: Secondary | ICD-10-CM | POA: Diagnosis present

## 2014-01-19 LAB — BASIC METABOLIC PANEL
ANION GAP: 12 (ref 5–15)
BUN: 56 mg/dL — ABNORMAL HIGH (ref 6–23)
CALCIUM: 8.9 mg/dL (ref 8.4–10.5)
CO2: 22 meq/L (ref 19–32)
CREATININE: 1.89 mg/dL — AB (ref 0.50–1.10)
Chloride: 101 mEq/L (ref 96–112)
GFR calc non Af Amer: 21 mL/min — ABNORMAL LOW (ref >90)
GFR, EST AFRICAN AMERICAN: 25 mL/min — AB (ref >90)
Glucose, Bld: 140 mg/dL — ABNORMAL HIGH (ref 70–99)
Potassium: 5.4 mEq/L — ABNORMAL HIGH (ref 3.7–5.3)
SODIUM: 135 meq/L — AB (ref 137–147)

## 2014-01-19 LAB — URINALYSIS, ROUTINE W REFLEX MICROSCOPIC
BILIRUBIN URINE: NEGATIVE
GLUCOSE, UA: NEGATIVE mg/dL
KETONES UR: NEGATIVE mg/dL
Nitrite: NEGATIVE
Protein, ur: NEGATIVE mg/dL
Specific Gravity, Urine: 1.019 (ref 1.005–1.030)
Urobilinogen, UA: 0.2 mg/dL (ref 0.0–1.0)
pH: 5 (ref 5.0–8.0)

## 2014-01-19 LAB — URINE MICROSCOPIC-ADD ON

## 2014-01-19 LAB — APTT: aPTT: 40 seconds — ABNORMAL HIGH (ref 24–37)

## 2014-01-19 LAB — I-STAT TROPONIN, ED: Troponin i, poc: 0.01 ng/mL (ref 0.00–0.08)

## 2014-01-19 LAB — CBC
HCT: 21.6 % — ABNORMAL LOW (ref 36.0–46.0)
Hemoglobin: 7 g/dL — ABNORMAL LOW (ref 12.0–15.0)
MCH: 31 pg (ref 26.0–34.0)
MCHC: 32.4 g/dL (ref 30.0–36.0)
MCV: 95.6 fL (ref 78.0–100.0)
PLATELETS: 219 10*3/uL (ref 150–400)
RBC: 2.26 MIL/uL — AB (ref 3.87–5.11)
RDW: 15.2 % (ref 11.5–15.5)
WBC: 8.4 10*3/uL (ref 4.0–10.5)

## 2014-01-19 LAB — MRSA PCR SCREENING: MRSA by PCR: NEGATIVE

## 2014-01-19 LAB — RETICULOCYTES
RBC.: 2.26 MIL/uL — AB (ref 3.87–5.11)
Retic Count, Absolute: 144.6 10*3/uL (ref 19.0–186.0)
Retic Ct Pct: 6.4 % — ABNORMAL HIGH (ref 0.4–3.1)

## 2014-01-19 LAB — PRO B NATRIURETIC PEPTIDE: PRO B NATRI PEPTIDE: 6160 pg/mL — AB (ref 0–450)

## 2014-01-19 LAB — POC OCCULT BLOOD, ED: FECAL OCCULT BLD: POSITIVE — AB

## 2014-01-19 LAB — PREPARE RBC (CROSSMATCH)

## 2014-01-19 LAB — PROTIME-INR
INR: 1.76 — ABNORMAL HIGH (ref 0.00–1.49)
PROTHROMBIN TIME: 20.5 s — AB (ref 11.6–15.2)

## 2014-01-19 LAB — TROPONIN I: Troponin I: <0.3 ng/mL (ref <0.30)

## 2014-01-19 LAB — ABO/RH: ABO/RH(D): O POS

## 2014-01-19 LAB — TSH: TSH: 10.51 u[IU]/mL — ABNORMAL HIGH (ref 0.350–4.500)

## 2014-01-19 MED ORDER — SODIUM CHLORIDE 0.9 % IV BOLUS (SEPSIS)
500.0000 mL | Freq: Four times a day (QID) | INTRAVENOUS | Status: DC | PRN
Start: 2014-01-19 — End: 2014-01-21

## 2014-01-19 MED ORDER — ONDANSETRON HCL 4 MG/2ML IJ SOLN: 4.0000 mg | Freq: Four times a day (QID) | INTRAMUSCULAR | Status: DC | PRN

## 2014-01-19 MED ORDER — ONDANSETRON HCL 4 MG PO TABS: 4.0000 mg | ORAL_TABLET | Freq: Four times a day (QID) | ORAL | Status: DC | PRN

## 2014-01-19 MED ORDER — SODIUM CHLORIDE 0.9 % IV SOLN: 250.0000 mL | INTRAVENOUS | Status: DC | PRN

## 2014-01-19 MED ORDER — SODIUM CHLORIDE 0.9 % IV SOLN
Freq: Once | INTRAVENOUS | Status: AC
  Administered 2014-01-19: 17:00:00 via INTRAVENOUS

## 2014-01-19 MED ORDER — LEVOTHYROXINE SODIUM 100 MCG PO TABS
100.0000 ug | ORAL_TABLET | Freq: Every day | ORAL | Status: DC
  Administered 2014-01-20 – 2014-01-23 (×4): 100 ug via ORAL
  Filled 2014-01-19 (×6): qty 1

## 2014-01-19 MED ORDER — FENTANYL CITRATE 0.05 MG/ML IJ SOLN
25.0000 ug | Freq: Once | INTRAMUSCULAR | Status: AC
  Administered 2014-01-19: 25 ug via INTRAVENOUS
  Filled 2014-01-19: qty 2

## 2014-01-19 MED ORDER — SODIUM CHLORIDE 0.9 % IV SOLN
INTRAVENOUS | Status: DC
  Administered 2014-01-20: 18:00:00 via INTRAVENOUS

## 2014-01-19 MED ORDER — SODIUM CHLORIDE 0.9 % IJ SOLN: 3.0000 mL | INTRAMUSCULAR | Status: DC | PRN

## 2014-01-19 MED ORDER — SODIUM CHLORIDE 0.9 % IV SOLN
80.0000 mg | Freq: Once | INTRAVENOUS | Status: AC
  Administered 2014-01-19: 80 mg via INTRAVENOUS
  Filled 2014-01-19: qty 80

## 2014-01-19 MED ORDER — SODIUM CHLORIDE 0.9 % IV BOLUS (SEPSIS)
250.0000 mL | Freq: Once | INTRAVENOUS | Status: AC
  Administered 2014-01-19: 250 mL via INTRAVENOUS

## 2014-01-19 MED ORDER — SODIUM CHLORIDE 0.9 % IJ SOLN
3.0000 mL | Freq: Two times a day (BID) | INTRAMUSCULAR | Status: DC
  Administered 2014-01-19 – 2014-01-20 (×2): 3 mL via INTRAVENOUS

## 2014-01-19 MED ORDER — ACETAMINOPHEN 325 MG PO TABS: 650.0000 mg | ORAL_TABLET | Freq: Four times a day (QID) | ORAL | Status: DC | PRN

## 2014-01-19 MED ORDER — SODIUM CHLORIDE 0.9 % IV SOLN
8.0000 mg/h | INTRAVENOUS | Status: DC
  Administered 2014-01-19 – 2014-01-21 (×4): 8 mg/h via INTRAVENOUS
  Filled 2014-01-19 (×13): qty 80

## 2014-01-19 MED ORDER — LEVALBUTEROL HCL 0.63 MG/3ML IN NEBU
0.6300 mg | INHALATION_SOLUTION | Freq: Four times a day (QID) | RESPIRATORY_TRACT | Status: DC | PRN
Start: 2014-01-19 — End: 2014-01-23
  Filled 2014-01-19: qty 3

## 2014-01-19 MED ORDER — ACETAMINOPHEN 650 MG RE SUPP: 650.0000 mg | Freq: Four times a day (QID) | RECTAL | Status: DC | PRN

## 2014-01-19 MED ORDER — SODIUM POLYSTYRENE SULFONATE 15 GM/60ML PO SUSP
15.0000 g | Freq: Once | ORAL | Status: AC
  Administered 2014-01-19: 15 g via ORAL
  Filled 2014-01-19: qty 60

## 2014-01-19 MED ORDER — SODIUM CHLORIDE 0.9 % IV SOLN
INTRAVENOUS | Status: DC
  Administered 2014-01-20: 04:00:00 via INTRAVENOUS

## 2014-01-19 MED ORDER — PANTOPRAZOLE SODIUM 40 MG IV SOLR
40.0000 mg | Freq: Two times a day (BID) | INTRAVENOUS | Status: DC
  Filled 2014-01-19: qty 40

## 2014-01-19 NOTE — ED Notes (Signed)
Attempted report 

## 2014-01-19 NOTE — Consult Note (Signed)
Referring Provider: Dr. Susie Cassette Primary Care Physician:  Kimber Relic, MD Primary Gastroenterologist:  Gentry Fitz  Reason for Consultation:  GI bleed  HPI: Sabrina Bishop is a 80 y.o. female admitted for shortness of breath and weakness that has occurred over the last 2 weeks. Chart history reports melena intermittently for 6 weeks but the patient denies having any black tarry stools stating that her stools have been dark brown over the last several weeks except for one episode of red blood per rectum last Tuesday. Denies dizziness, nausea, vomiting, abdominal pain, heartburn. Has felt dyspnea on exertion that she says is not new for her. No history of peptic ulcer disease. Lost about 10 pounds in the past 6 months. Hgb 7 (10 on 01/10/14). Heme positive. Hypotensive in the 90's systolic. On Eliquis for Afib. Has never had a colonoscopy.   Past Medical History  Diagnosis Date  . Hypothyroidism   . OA (osteoarthritis)   . HTN (hypertension)   . Diastolic dysfunction   . AV BLOCK, COMPLETE 03/26/2009    Qualifier: Diagnosis of  By: Graciela Husbands, MD, Ruthann Cancer Ty Hilts   . Pacemaker-mdt DDD 12/16/2010  . Keratoconus, unspecified   . Disturbance of salivary secretion   . GERD (gastroesophageal reflux disease)   . Osteoarthrosis, unspecified whether generalized or localized, unspecified site   . Pain in joint, ankle and foot   . Spinal stenosis, lumbar region, without neurogenic claudication   . Lumbago   . Trigger finger (acquired)   . Abnormality of gait   . Edema   . Dysphagia, unspecified(787.20)   . Seborrheic keratosis 05/2012  . Other atopic dermatitis and related conditions 03/2012  . Unspecified diastolic heart failure 02/16/2012  . Mixed hyperlipidemia 08/2011  . Chronic kidney disease, stage II (mild) 08/2011  . Hypoxemia 08/2011  . Enthesopathy of hip region 01/2011  . Keratoconjunctivitis 2011  . Atrial fibrillation 02/15/2013  . Unspecified venous (peripheral) insufficiency  2010  . Pain in joint, shoulder region 2015    left  . Keratoacanthoma of hand 2015    left  . Actinic keratoses 2015    right arm and left cheek    Past Surgical History  Procedure Laterality Date  . Total knee arthroplasty  2005    left  . Abdominal hysterectomy  1950  . Pacemaker generator change  04/2012    Dr. Graciela Husbands  . Pacemaker placement  2004  . Total knee arthroplasty  1995    right  . Cataract extraction w/ intraocular lens implant  2012    right    Prior to Admission medications   Medication Sig Start Date End Date Taking? Authorizing Provider  amoxicillin (AMOXIL) 500 MG capsule Take 2,000 mg by mouth once as needed (prior to dental appointments).   Yes Historical Provider, MD  apixaban (ELIQUIS) 2.5 MG TABS tablet Take 2.5 mg by mouth 2 (two) times daily.   Yes Historical Provider, MD  atenolol (TENORMIN) 50 MG tablet Take 50 mg by mouth daily.   Yes Historical Provider, MD  atorvastatin (LIPITOR) 10 MG tablet Take 10 mg by mouth daily.   Yes Historical Provider, MD  HYDROcodone-acetaminophen (NORCO) 7.5-325 MG per tablet Take 1 tablet by mouth every 4 (four) hours.   Yes Historical Provider, MD  isosorbide mononitrate (IMDUR) 30 MG 24 hr tablet Take 15 mg by mouth daily.   Yes Historical Provider, MD  levothyroxine (SYNTHROID, LEVOTHROID) 100 MCG tablet Take 100 mcg by mouth daily before breakfast.   Yes  Historical Provider, MD  losartan (COZAAR) 50 MG tablet Take 50 mg by mouth daily.   Yes Historical Provider, MD  naproxen sodium (ANAPROX) 220 MG tablet Take 220 mg by mouth 2 (two) times daily with a meal.   Yes Historical Provider, MD    Scheduled Meds: . [START ON 01/20/2014] levothyroxine  100 mcg Oral QAC breakfast  . [START ON 01/23/2014] pantoprazole (PROTONIX) IV  40 mg Intravenous Q12H  . sodium chloride  3 mL Intravenous Q12H  . sodium chloride  3 mL Intravenous Q12H  . sodium polystyrene  15 g Oral Once   Continuous Infusions: . sodium chloride    .  pantoprozole (PROTONIX) infusion     PRN Meds:.sodium chloride, acetaminophen, acetaminophen, levalbuterol, ondansetron (ZOFRAN) IV, ondansetron, sodium chloride, sodium chloride  Allergies as of 01/19/2014 - Review Complete 01/19/2014  Allergen Reaction Noted  . Vioxx [rofecoxib] Other (See Comments) 01/19/2014  . Adhesive [tape] Rash 02/15/2013    Family History  Problem Relation Age of Onset  . Hyperlipidemia    . Hypertension    . Osteoporosis    . Stroke Father   . Congestive Heart Failure Brother   . Congestive Heart Failure Brother     History   Social History  . Marital Status: Widowed    Spouse Name: N/A    Number of Children: N/A  . Years of Education: N/A   Occupational History  . Retired Child psychotherapist   .     Social History Main Topics  . Smoking status: Never Smoker   . Smokeless tobacco: Never Used  . Alcohol Use: No  . Drug Use: No  . Sexual Activity: No   Other Topics Concern  . Not on file   Social History Narrative   Lives at [facility] since 2005   Widowed   Pacemaker   Power wheelchair and cane when walking   Never smoked   Exercise none     Review of Systems: All negative except as stated above in HPI.  Physical Exam: Vital signs: Filed Vitals:   01/19/14 2011  BP: 92/33  Pulse: 62  Temp: 97.4 F (36.3 C)  Resp: 18     General:   Elderly, Alert,  Well-developed, well-nourished, pleasant and cooperative in NAD HEENT: anicteric Neck: nontender, supple Lungs:  Clear throughout to auscultation.   No wheezes, crackles, or rhonchi. No acute distress. Heart:  Regular rate and rhythm; no murmurs, clicks, rubs,  or gallops. Abdomen: minimal epigastric tenderness without guarding, soft, nondistended, +BS  Rectal:  Deferred Ext: Trace LE edema; tender to touch bilaterally  GI:  Lab Results:  Recent Labs  01/19/14 1323  WBC 8.4  HGB 7.0*  HCT 21.6*  PLT 219   BMET  Recent Labs  01/18/14 01/19/14 1323  NA 133*  135*  K 5.6* 5.4*  CL  --  101  CO2  --  22  GLUCOSE  --  140*  BUN 60* 56*  CREATININE 1.8* 1.89*  CALCIUM  --  8.9   LFT No results found for this basename: PROT, ALBUMIN, AST, ALT, ALKPHOS, BILITOT, BILIDIR, IBILI,  in the last 72 hours PT/INR  Recent Labs  01/19/14 1555  LABPROT 20.5*  INR 1.76*     Studies/Results: Dg Chest 2 View  01/19/2014   CLINICAL DATA:  Weakness.  Shortness of breath.  Swelling in legs.  EXAM: CHEST  2 VIEW  COMPARISON:  12/07/2013.  FINDINGS: Mediastinum hilar structures are normal. Cardiac pacer  and lead tips in right atrium and right ventricle. Cardiomegaly with normal pulmonary vascularity. Right base subsegmental atelectasis. Small bilateral pleural effusions. No pneumothorax. No acute bony abnormality. Diffuse osteopenia degenerative changes of the thoracolumbar spine with multiple stable lumbar compression fractures.  IMPRESSION: 1. Stable cardiomegaly. Pulmonary vascularity is normal. Cardiac pacer noted with lead tips in the right atrium and right ventricle. 2. Mild bibasilar subsegmental axis with small bilateral pleural effusions. 3. Stable lumbar compression fractures. Diffuse thoracolumbar spine osteopenia and degenerative change.   Electronically Signed   By: Maisie Fushomas  Register   On: 01/19/2014 15:01    Impression/Plan: 80 yo with anemia, heme positive stool, and question of melena but has had a 3 g drop in Hgb in less than 2 weeks. No evidence of an ongoing GI bleed. Needs volume resuscitation, IV PPI Q 12 hours. EGD in AM to look for a source of her GI bleed. Will hold off on setting up for a colonoscopy unless anemia persists despite blood transfusions or visible bleeding develops due to her advanced age and multiple comorbidities.    LOS: 0 days   Kerilyn Cortner C.  01/19/2014, 8:17 PM

## 2014-01-19 NOTE — ED Provider Notes (Signed)
Medical screening examination/treatment/procedure(s) were conducted as a shared visit with non-physician practitioner(s) and myself.  I personally evaluated the patient during the encounter.   EKG Interpretation None        EKG Interpretation None     I assessed the patient and reviewed her history present illness. Her symptoms are consistent with GI bleed. The patient is anticoagulated on Eliquis which was just started recently. She is pale and hypotensive although her mental status is clear and her respirations are nonlabored. She follows commands without difficulty. She has a paced regular cardiac rhythm in the 60s, her lungs are clear to auscultation. Her abdomen is soft. Rectal exam done by PA, is positive for melena. Have ordered for 2 units of blood to be transfused. The patient will be admitted for further treatment of GI bleed on anticoagulants. CRITICAL CARE Performed by: Arby BarrettePfeiffer, Cricket Goodlin   Total critical care time: 30  Critical care time was exclusive of separately billable procedures and treating other patients.  Critical care was necessary to treat or prevent imminent or life-threatening deterioration.  Critical care was time spent personally by me on the following activities: development of treatment plan with patient and/or surrogate as well as nursing, discussions with consultants, evaluation of patient's response to treatment, examination of patient, obtaining history from patient or surrogate, ordering and performing treatments and interventions, ordering and review of laboratory studies, ordering and review of radiographic studies, pulse oximetry and re-evaluation of patient's condition.    Arby BarretteMarcy Irie Dowson, MD 01/19/14 [phone]

## 2014-01-19 NOTE — ED Notes (Signed)
Inc. Weakness x 1 weakness since pcp d/c'd diuretics. Inc. Dependent edema and sob. Feeling weak, tired for x 2 weeks. Alert and oriented.  [facility]

## 2014-01-19 NOTE — Telephone Encounter (Signed)
Bloodwork Results 01/18/2014  Renal Profile Sodium  133 Potassium 5.6 Glucose  107 BUN  60 Creatinine 1.78 Calcium  8.8  Called Dr. Chilton SiGreen and gave him values over the phone and he stated that he will call patient. Abstracted into chart.

## 2014-01-19 NOTE — ED Provider Notes (Signed)
CSN: [id number]     Arrival date & time 01/19/14  1314 History   First MD Initiated Contact with Patient 01/19/14 1318     Chief Complaint  Patient presents with  . Weakness  . Shortness of Breath  . Leg Swelling   Patient is a 80 y.o. female presenting with weakness and shortness of breath.  Weakness Associated symptoms include fatigue and weakness. Pertinent negatives include no abdominal pain, chest pain, chills, fever, nausea, neck pain or vomiting.  Shortness of Breath Associated symptoms: no abdominal pain, no chest pain, no fever, no neck pain and no vomiting    Patient is a 80 y.o. Female who comes from [facility] with complaint of two weeks of worsening shortness of breath and weakness.  Per the patient she has had a lot of weakness and doesn't feel that she can walk around without possibly falling.  Patient states that she normally walks with a walker, but has had to use her wheel chair more frequently due to weakness.  Patient also admits to 6 weeks of melanic stools.  She states that she was recently placed on elequis and feels that this is when she started on her downward decline.  Patient has a history of a complete AV block with a pacemaker placement and atrial fibrillation.  Patient recently saw her cardiologist Dr. Graciela Husbands who told her that her pacemaker is not working correctly.  She has been in an atrial arrhythmia since May of 2015.    Past Medical History  Diagnosis Date  . Hypothyroidism   . OA (osteoarthritis)   . HTN (hypertension)   . Diastolic dysfunction   . AV BLOCK, COMPLETE 03/26/2009    Qualifier: Diagnosis of  By: Graciela Husbands, MD, Ruthann Cancer Ty Hilts   . Pacemaker-mdt DDD 12/16/2010  . Keratoconus, unspecified   . Disturbance of salivary secretion   . GERD (gastroesophageal reflux disease)   . Osteoarthrosis, unspecified whether generalized or localized, unspecified site   . Pain in joint, ankle and foot   . Spinal stenosis, lumbar region, without neurogenic  claudication   . Lumbago   . Trigger finger (acquired)   . Abnormality of gait   . Edema   . Dysphagia, unspecified(787.20)   . Seborrheic keratosis 05/2012  . Other atopic dermatitis and related conditions 03/2012  . Unspecified diastolic heart failure 02/16/2012  . Mixed hyperlipidemia 08/2011  . Chronic kidney disease, stage II (mild) 08/2011  . Hypoxemia 08/2011  . Enthesopathy of hip region 01/2011  . Keratoconjunctivitis 2011  . Atrial fibrillation 02/15/2013  . Unspecified venous (peripheral) insufficiency 2010  . Pain in joint, shoulder region 2015    left  . Keratoacanthoma of hand 2015    left  . Actinic keratoses 2015    right arm and left cheek   Past Surgical History  Procedure Laterality Date  . Total knee arthroplasty  2005    left  . Abdominal hysterectomy  1950  . Pacemaker generator change  04/2012    Dr. Graciela Husbands  . Pacemaker placement  2004  . Total knee arthroplasty  1995    right  . Cataract extraction w/ intraocular lens implant  2012    right   Family History  Problem Relation Age of Onset  . Hyperlipidemia    . Hypertension    . Osteoporosis    . Stroke Father   . Congestive Heart Failure Brother   . Congestive Heart Failure Brother    History  Substance Use Topics  .  Smoking status: Never Smoker   . Smokeless tobacco: Never Used  . Alcohol Use: No   OB History   Grav Para Term Preterm Abortions TAB SAB Ect Mult Living                 Review of Systems  Constitutional: Positive for activity change and fatigue. Negative for fever and chills.  Respiratory: Positive for shortness of breath. Negative for chest tightness.   Cardiovascular: Positive for leg swelling. Negative for chest pain and palpitations.  Gastrointestinal: Positive for constipation. Negative for nausea, vomiting, abdominal pain, diarrhea, blood in stool and rectal pain.  Genitourinary: Negative for dysuria, urgency, frequency, hematuria and difficulty urinating.   Musculoskeletal: Negative for back pain and neck pain.  Neurological: Positive for weakness.  All other systems reviewed and are negative.     Allergies  Vioxx and Adhesive  Home Medications   Prior to Admission medications   Medication Sig Start Date End Date Taking? Authorizing Provider  amoxicillin (AMOXIL) 500 MG capsule Take 2,000 mg by mouth once as needed (prior to dental appointments).   Yes Historical Provider, MD  apixaban (ELIQUIS) 2.5 MG TABS tablet Take 2.5 mg by mouth 2 (two) times daily.   Yes Historical Provider, MD  atenolol (TENORMIN) 50 MG tablet Take 50 mg by mouth daily.   Yes Historical Provider, MD  atorvastatin (LIPITOR) 10 MG tablet Take 10 mg by mouth daily.   Yes Historical Provider, MD  HYDROcodone-acetaminophen (NORCO) 7.5-325 MG per tablet Take 1 tablet by mouth every 4 (four) hours.   Yes Historical Provider, MD  isosorbide mononitrate (IMDUR) 30 MG 24 hr tablet Take 15 mg by mouth daily.   Yes Historical Provider, MD  levothyroxine (SYNTHROID, LEVOTHROID) 100 MCG tablet Take 100 mcg by mouth daily before breakfast.   Yes Historical Provider, MD  losartan (COZAAR) 50 MG tablet Take 50 mg by mouth daily.   Yes Historical Provider, MD  naproxen sodium (ANAPROX) 220 MG tablet Take 220 mg by mouth 2 (two) times daily with a meal.   Yes Historical Provider, MD   BP 85/28  Pulse 61  Temp(Src) 98 F (36.7 C) (Oral)  Resp 16  SpO2 100% Physical Exam  Nursing note and vitals reviewed. Constitutional: She is oriented to person, place, and time. She appears well-developed and well-nourished. No distress.  HENT:  Head: Normocephalic and atraumatic.  Mouth/Throat: Oropharynx is clear and moist. No oropharyngeal exudate.  Pale mucosal membranes  Eyes: Conjunctivae and EOM are normal. Pupils are equal, round, and reactive to light. No scleral icterus.  Neck: Normal range of motion. Neck supple. No JVD present. No thyromegaly present.  Cardiovascular: Normal  rate, regular rhythm and intact distal pulses.  Exam reveals no gallop and no friction rub.   Murmur heard. Pulmonary/Chest: Effort normal and breath sounds normal. No respiratory distress. She has no wheezes. She has no rales. She exhibits no tenderness.  Abdominal: Soft. Bowel sounds are normal. She exhibits no distension and no mass. There is no tenderness. There is no rebound and no guarding.  Genitourinary: Rectal exam shows no external hemorrhoid, no internal hemorrhoid, no fissure, no mass, no tenderness and anal tone normal. Guaiac positive stool.  Musculoskeletal: Normal range of motion.  Lymphadenopathy:    She has no cervical adenopathy.  Neurological: She is alert and oriented to person, place, and time. She has normal strength. No cranial nerve deficit or sensory deficit. Coordination normal.  Skin: Skin is warm and dry. She is  not diaphoretic.  Psychiatric: She has a normal mood and affect. Her behavior is normal. Judgment and thought content normal.    ED Course  Procedures (including critical care time) Labs Review Labs Reviewed  CBC - Abnormal; Notable for the following:    RBC 2.26 (*)    Hemoglobin 7.0 (*)    HCT 21.6 (*)    All other components within normal limits  BASIC METABOLIC PANEL - Abnormal; Notable for the following:    Sodium 135 (*)    Potassium 5.4 (*)    Glucose, Bld 140 (*)    BUN 56 (*)    Creatinine, Ser 1.89 (*)    GFR calc non Af Amer 21 (*)    GFR calc Af Amer 25 (*)    All other components within normal limits  PRO B NATRIURETIC PEPTIDE - Abnormal; Notable for the following:    Pro B Natriuretic peptide (BNP) 6160.0 (*)    All other components within normal limits  URINALYSIS, ROUTINE W REFLEX MICROSCOPIC - Abnormal; Notable for the following:    Hgb urine dipstick SMALL (*)    Leukocytes, UA SMALL (*)    All other components within normal limits  URINE MICROSCOPIC-ADD ON - Abnormal; Notable for the following:    Squamous Epithelial / LPF  FEW (*)    Bacteria, UA FEW (*)    Casts HYALINE CASTS (*)    All other components within normal limits  POC OCCULT BLOOD, ED - Abnormal; Notable for the following:    Fecal Occult Bld POSITIVE (*)    All other components within normal limits  PROTIME-INR  APTT  I-STAT TROPOININ, ED  PREPARE RBC (CROSSMATCH)  TYPE AND SCREEN    Imaging Review Dg Chest 2 View  01/19/2014   CLINICAL DATA:  Weakness.  Shortness of breath.  Swelling in legs.  EXAM: CHEST  2 VIEW  COMPARISON:  12/07/2013.  FINDINGS: Mediastinum hilar structures are normal. Cardiac pacer and lead tips in right atrium and right ventricle. Cardiomegaly with normal pulmonary vascularity. Right base subsegmental atelectasis. Small bilateral pleural effusions. No pneumothorax. No acute bony abnormality. Diffuse osteopenia degenerative changes of the thoracolumbar spine with multiple stable lumbar compression fractures.  IMPRESSION: 1. Stable cardiomegaly. Pulmonary vascularity is normal. Cardiac pacer noted with lead tips in the right atrium and right ventricle. 2. Mild bibasilar subsegmental axis with small bilateral pleural effusions. 3. Stable lumbar compression fractures. Diffuse thoracolumbar spine osteopenia and degenerative change.   Electronically Signed   By: Maisie Fus  Register   On: 01/19/2014 15:01     EKG Interpretation None      MDM   Final diagnoses:  CKD (chronic kidney disease), stage 4 (severe)  Iron deficiency anemia due to chronic blood loss  Gastrointestinal hemorrhage with melena  Congestive heart failure, unspecified congestive heart failure chronicity, unspecified congestive heart failure type   Patient is a 80 y.o. Female who presents to the ED with shortness of breath and weakness.  Physical exam reveals mild pallor and benign abdomen on palpation.  Patient is hemoccult positive here with frank melanic stool.  CBC reveals new onset anemia with a hemoglobin of 7.  BMP reveals CKD with mild hyponatremia and  hyperkalemia.  UA reveals small leukocytes and hyaline casts.  Will culture urine at this time.  istat troponin is negative.  PT/INR, PTT are currently pending.  CXR reveals no acute consolidations, but cardiomegaly.  Given anemia patient has been typed and crossed and will transfuse with 2  units PRBCs.  Will admit the patient to the hospital at this time.  Suspect GI bleed secondary to eliquis.  Patient is a DNR/DNI. I spoke with Dr. Sunnie Nielsen who is concerned with admitting the patient to stepdown at 4:20 pm.  She recommended consulting Critical care who did not feel that the patient was appropriate for ICU.  They asked that I speak with the hospitalists about [facility] admitting to stepdown.  I have spoken with Dr. Susie Cassette who will admit the patient to stepdown.      Eben Burow, PA-C 01/19/14 1658

## 2014-01-19 NOTE — ED Notes (Signed)
Pacemaker interrogated and faxed over to [facility].

## 2014-01-19 NOTE — ED Notes (Signed)
Patient transported to X-ray 

## 2014-01-19 NOTE — ED Notes (Signed)
Pt reports has had dark black stools x 6 weeks, intermittent bright red blood when wiping.

## 2014-01-19 NOTE — H&P (Addendum)
[facility] History and Physical  Rodman PickleCatherine H Bishop ZOX:[id number] DOB: Apr 29, 1916 DOA: 01/19/2014  Referring physician:  PCP: Kimber RelicGREEN, ARTHUR G, MD   Chief Complaint:Weakness  .  Shortness of Breath  .  Leg Swelling   HPI:  80 year old female who presents with shortness of breath and weakness worsening over the last 2 weeks.. She complained of several episodes of of melena to her daughter-in-law, on and off for the last 6 weeks.. She has also had more shortness of breath and has seen her pulmonologist as well as her cardiologist for evaluation. Patient is anemic and has dropped her hemoglobin from 10-7 in 9 days. She is on eliquis  for atrial fibrillation The patient denies any chest pain. She has had some nausea, but no episodes of vomiting. She has lost about 7-10 pounds approximately in the last 6 months. She has never had a colonoscopy. She does not have a gastroenterologist. Patient recently had a 2-D echo at her cardiologist's office which showed normal systolic function. Creatinine had gone up to 2.3 from a baseline of 1.7-1.8 and she was asked to discontinue the Lasix. She does not note any worsening of her shortness of breath after discontinuing the Lasix.   Review of Systems: negative for the following  Constitutional: Positive for activity change and fatigue. Negative for fever and chills.  Respiratory: Positive for shortness of breath. Negative for chest tightness.  Cardiovascular: Positive for leg swelling. Negative for chest pain and palpitations.  Gastrointestinal: Positive for constipation. Negative for nausea, vomiting, abdominal pain, diarrhea, blood in stool and rectal pain.  Genitourinary: Negative for dysuria, urgency, frequency, hematuria and difficulty urinating.  Musculoskeletal: Negative for back pain and neck pain.  Neurological: Positive for weakness.  All other systems reviewed and are negative. Hematological: Denies adenopathy. Easy bruising, personal or  family bleeding history  Psychiatric/Behavioral: Denies suicidal ideation, mood changes, confusion, nervousness, sleep disturbance and agitation       Past Medical History  Diagnosis Date  . Hypothyroidism   . OA (osteoarthritis)   . HTN (hypertension)   . Diastolic dysfunction   . AV BLOCK, COMPLETE 03/26/2009    Qualifier: Diagnosis of  By: Graciela HusbandsKlein, MD, Ruthann CancerFACC, Ty HiltsSteven Cochran   . Pacemaker-mdt DDD 12/16/2010  . Keratoconus, unspecified   . Disturbance of salivary secretion   . GERD (gastroesophageal reflux disease)   . Osteoarthrosis, unspecified whether generalized or localized, unspecified site   . Pain in joint, ankle and foot   . Spinal stenosis, lumbar region, without neurogenic claudication   . Lumbago   . Trigger finger (acquired)   . Abnormality of gait   . Edema   . Dysphagia, unspecified(787.20)   . Seborrheic keratosis 05/2012  . Other atopic dermatitis and related conditions 03/2012  . Unspecified diastolic heart failure 02/16/2012  . Mixed hyperlipidemia 08/2011  . Chronic kidney disease, stage II (mild) 08/2011  . Hypoxemia 08/2011  . Enthesopathy of hip region 01/2011  . Keratoconjunctivitis 2011  . Atrial fibrillation 02/15/2013  . Unspecified venous (peripheral) insufficiency 2010  . Pain in joint, shoulder region 2015    left  . Keratoacanthoma of hand 2015    left  . Actinic keratoses 2015    right arm and left cheek     Past Surgical History  Procedure Laterality Date  . Total knee arthroplasty  2005    left  . Abdominal hysterectomy  1950  . Pacemaker generator change  04/2012    Dr. Graciela HusbandsKlein  . Pacemaker placement  2004  .  Total knee arthroplasty  1995    right  . Cataract extraction w/ intraocular lens implant  2012    right      Social History:  reports that she has never smoked. She has never used smokeless tobacco. She reports that she does not drink alcohol or use illicit drugs.     Allergies  Allergen Reactions  . Vioxx [Rofecoxib]  Other (See Comments)    Pt doesn't remember  . Adhesive [Tape] Rash    Family History  Problem Relation Age of Onset  . Hyperlipidemia    . Hypertension    . Osteoporosis    . Stroke Father   . Congestive Heart Failure Brother   . Congestive Heart Failure Brother      Prior to Admission medications   Medication Sig Start Date End Date Taking? Authorizing Provider  amoxicillin (AMOXIL) 500 MG capsule Take 2,000 mg by mouth once as needed (prior to dental appointments).   Yes Historical Provider, MD  apixaban (ELIQUIS) 2.5 MG TABS tablet Take 2.5 mg by mouth 2 (two) times daily.   Yes Historical Provider, MD  atenolol (TENORMIN) 50 MG tablet Take 50 mg by mouth daily.   Yes Historical Provider, MD  atorvastatin (LIPITOR) 10 MG tablet Take 10 mg by mouth daily.   Yes Historical Provider, MD  HYDROcodone-acetaminophen (NORCO) 7.5-325 MG per tablet Take 1 tablet by mouth every 4 (four) hours.   Yes Historical Provider, MD  isosorbide mononitrate (IMDUR) 30 MG 24 hr tablet Take 15 mg by mouth daily.   Yes Historical Provider, MD  levothyroxine (SYNTHROID, LEVOTHROID) 100 MCG tablet Take 100 mcg by mouth daily before breakfast.   Yes Historical Provider, MD  losartan (COZAAR) 50 MG tablet Take 50 mg by mouth daily.   Yes Historical Provider, MD  naproxen sodium (ANAPROX) 220 MG tablet Take 220 mg by mouth 2 (two) times daily with a meal.   Yes Historical Provider, MD     Physical Exam: Filed Vitals:   01/19/14 1545 01/19/14 1602 01/19/14 1645 01/19/14 1730  BP: 92/34 92/72 91/50  95/36  Pulse:      Temp:      TempSrc:      Resp: 15 22 16 15   SpO2:         Constitutional: Vital signs reviewed. Patient is a well-developed and well-nourished in no acute distress and cooperative with exam. Alert and oriented x3.  Head: Normocephalic and atraumatic  Ear: TM normal bilaterally  Mouth: no erythema or exudates, MMM  Eyes: PERRL, EOMI, conjunctivae normal, No scleral icterus.  Neck:  Supple, Trachea midline normal ROM, No JVD, mass, thyromegaly, or carotid bruit present.  Cardiovascular: RRR, S1 normal, S2 normal, Murmur, pulses symmetric and intact bilaterally  Pulmonary/Chest: CTAB, no wheezes, rales, or rhonchi  Abdominal: Soft. Non-tender, non-distended, bowel sounds are normal, no masses, organomegaly, or guarding present. Guaiac positive stool GU: no CVA tenderness Musculoskeletal: No joint deformities, erythema, or stiffness, ROM full and no nontender Ext: no edema and no cyanosis, pulses palpable bilaterally (DP and PT)  Hematology: no cervical, inginal, or axillary adenopathy.  Neurological: A&O x3, Strenght is normal and symmetric bilaterally, cranial nerve II-XII are grossly intact, no focal motor deficit, sensory intact to light touch bilaterally.  Skin: Warm, dry and intact. No rash, cyanosis, or clubbing.  Psychiatric: Normal mood and affect. speech and behavior is normal. Judgment and thought content normal. Cognition and memory are normal.       Labs on Admission:  Basic Metabolic Panel:  Recent Labs Lab 01/18/14 01/19/14 1323  NA 133* 135*  K 5.6* 5.4*  CL  --  101  CO2  --  22  GLUCOSE  --  140*  BUN 60* 56*  CREATININE 1.8* 1.89*  CALCIUM  --  8.9   Liver Function Tests: No results found for this basename: AST, ALT, ALKPHOS, BILITOT, PROT, ALBUMIN,  in the last 168 hours No results found for this basename: LIPASE, AMYLASE,  in the last 168 hours No results found for this basename: AMMONIA,  in the last 168 hours CBC:  Recent Labs Lab 01/19/14 1323  WBC 8.4  HGB 7.0*  HCT 21.6*  MCV 95.6  PLT 219   Cardiac Enzymes: No results found for this basename: CKTOTAL, CKMB, CKMBINDEX, TROPONINI,  in the last 168 hours  BNP (last 3 results)  Recent Labs  01/10/14 1541 01/19/14 1323  PROBNP 449.0* 6160.0*      CBG: No results found for this basename: GLUCAP,  in the last 168 hours  Radiological Exams on Admission: Dg  Chest 2 View  01/19/2014   CLINICAL DATA:  Weakness.  Shortness of breath.  Swelling in legs.  EXAM: CHEST  2 VIEW  COMPARISON:  12/07/2013.  FINDINGS: Mediastinum hilar structures are normal. Cardiac pacer and lead tips in right atrium and right ventricle. Cardiomegaly with normal pulmonary vascularity. Right base subsegmental atelectasis. Small bilateral pleural effusions. No pneumothorax. No acute bony abnormality. Diffuse osteopenia degenerative changes of the thoracolumbar spine with multiple stable lumbar compression fractures.  IMPRESSION: 1. Stable cardiomegaly. Pulmonary vascularity is normal. Cardiac pacer noted with lead tips in the right atrium and right ventricle. 2. Mild bibasilar subsegmental axis with small bilateral pleural effusions. 3. Stable lumbar compression fractures. Diffuse thoracolumbar spine osteopenia and degenerative change.   Electronically Signed   By: Maisie Fus  Register   On: 01/19/2014 15:01    EKG: Independently reviewed.   Assessment/Plan Active Problems:   Anemia   GI bleed   GI bleeding Patient on eliquis, history of atrial fibrillation This could BE held Will admit the patient to step down Transfuse 2 units of packed red blood cells Post transfusion H&H Probably GI Start the patient on a PPI drip Patient has never had a colonoscopy Hold antihypertensive medications [facility] paged  Waiting for a call back Anemia panel    Chronic kidney disease, stage III Lasix recently discontinued Creatinine at baseline Holding ACE inhibitor She will need outpatient nephrology evaluation  Hyperkalemia will give one dose of kayexalate   Atrial fibrillation currently rate controlled and rhythm   Code Status:   full Family Communication: bedside Disposition Plan: admit   Time spent: 70 mins   [facility] Pager [phone]  If 7PM-7AM, please contact night-coverage [email or web address] Password [facility] 01/19/2014, 5:54  PM

## 2014-01-20 ENCOUNTER — Encounter (HOSPITAL_COMMUNITY): Payer: Self-pay | Admitting: *Deleted

## 2014-01-20 ENCOUNTER — Encounter (HOSPITAL_COMMUNITY)
Admission: EM | Disposition: A | Discharge status: Skilled Nursing Facility | Payer: Self-pay | Source: Home / Self Care | Attending: Internal Medicine

## 2014-01-20 DIAGNOSIS — N184 Chronic kidney disease, stage 4 (severe): Secondary | ICD-10-CM

## 2014-01-20 HISTORY — PX: ESOPHAGOGASTRODUODENOSCOPY: SHX5428

## 2014-01-20 LAB — TROPONIN I: Troponin I: <0.3 ng/mL (ref <0.30)

## 2014-01-20 LAB — FERRITIN: Ferritin: 18 ng/mL (ref 10–291)

## 2014-01-20 LAB — CBC
HCT: 33.1 % — ABNORMAL LOW (ref 36.0–46.0)
HEMATOCRIT: 34.9 % — AB (ref 36.0–46.0)
HEMOGLOBIN: 11.7 g/dL — AB (ref 12.0–15.0)
Hemoglobin: 11.4 g/dL — ABNORMAL LOW (ref 12.0–15.0)
MCH: 30.4 pg (ref 26.0–34.0)
MCH: 29.1 pg (ref 26.0–34.0)
MCHC: 34.4 g/dL (ref 30.0–36.0)
MCHC: 33.5 g/dL (ref 30.0–36.0)
MCV: 88.3 fL (ref 78.0–100.0)
MCV: 86.8 fL (ref 78.0–100.0)
Platelets: 227 10*3/uL (ref 150–400)
Platelets: 184 10*3/uL (ref 150–400)
RBC: 3.75 MIL/uL — ABNORMAL LOW (ref 3.87–5.11)
RBC: 4.02 MIL/uL (ref 3.87–5.11)
RDW: 16.5 % — AB (ref 11.5–15.5)
RDW: 16.7 % — ABNORMAL HIGH (ref 11.5–15.5)
WBC: 8.3 10*3/uL (ref 4.0–10.5)
WBC: 8.4 10*3/uL (ref 4.0–10.5)

## 2014-01-20 LAB — COMPREHENSIVE METABOLIC PANEL
ALT: 52 U/L — ABNORMAL HIGH (ref 0–35)
ANION GAP: 15 (ref 5–15)
AST: 86 U/L — AB (ref 0–37)
Albumin: 3 g/dL — ABNORMAL LOW (ref 3.5–5.2)
Alkaline Phosphatase: 89 U/L (ref 39–117)
BILIRUBIN TOTAL: 0.6 mg/dL (ref 0.3–1.2)
BUN: 51 mg/dL — AB (ref 6–23)
CHLORIDE: 104 meq/L (ref 96–112)
CO2: 17 mEq/L — ABNORMAL LOW (ref 19–32)
Calcium: 8.5 mg/dL (ref 8.4–10.5)
Creatinine, Ser: 1.87 mg/dL — ABNORMAL HIGH (ref 0.50–1.10)
GFR calc Af Amer: 25 mL/min — ABNORMAL LOW (ref >90)
GFR calc non Af Amer: 22 mL/min — ABNORMAL LOW (ref >90)
Glucose, Bld: 111 mg/dL — ABNORMAL HIGH (ref 70–99)
POTASSIUM: 5.6 meq/L — AB (ref 3.7–5.3)
Sodium: 136 mEq/L — ABNORMAL LOW (ref 137–147)
Total Protein: 6.3 g/dL (ref 6.0–8.3)

## 2014-01-20 LAB — VITAMIN B12: Vitamin B-12: 1000 pg/mL — ABNORMAL HIGH (ref 211–911)

## 2014-01-20 LAB — IRON AND TIBC
Iron: 17 ug/dL — ABNORMAL LOW (ref 42–135)
Saturation Ratios: 6 % — ABNORMAL LOW (ref 20–55)
TIBC: 296 ug/dL (ref 250–470)
UIBC: 279 ug/dL (ref 125–400)

## 2014-01-20 LAB — FOLATE: FOLATE: 11.9 ng/mL

## 2014-01-20 SURGERY — EGD (ESOPHAGOGASTRODUODENOSCOPY)
Anesthesia: Moderate Sedation

## 2014-01-20 MED ORDER — EPINEPHRINE HCL 0.1 MG/ML IJ SOSY
PREFILLED_SYRINGE | INTRAMUSCULAR | Status: AC
  Filled 2014-01-20: qty 10

## 2014-01-20 MED ORDER — HYDROCODONE-ACETAMINOPHEN 7.5-325 MG PO TABS
1.0000 | ORAL_TABLET | ORAL | Status: DC
  Administered 2014-01-20 – 2014-01-23 (×13): 1 via ORAL
  Filled 2014-01-20 (×14): qty 1

## 2014-01-20 MED ORDER — MIDAZOLAM HCL 5 MG/ML IJ SOLN
INTRAMUSCULAR | Status: AC
  Filled 2014-01-20: qty 2

## 2014-01-20 MED ORDER — FENTANYL CITRATE 0.05 MG/ML IJ SOLN
INTRAMUSCULAR | Status: DC | PRN
  Administered 2014-01-20 (×2): 12.5 ug via INTRAVENOUS

## 2014-01-20 MED ORDER — ATENOLOL 50 MG PO TABS: 50.0000 mg | ORAL_TABLET | Freq: Every day | ORAL | Status: DC

## 2014-01-20 MED ORDER — PNEUMOCOCCAL VAC POLYVALENT 25 MCG/0.5ML IJ INJ: 0.5000 mL | INJECTION | INTRAMUSCULAR | Status: DC | PRN

## 2014-01-20 MED ORDER — ISOSORBIDE MONONITRATE 15 MG HALF TABLET
15.0000 mg | ORAL_TABLET | Freq: Every day | ORAL | Status: DC
  Administered 2014-01-20 – 2014-01-23 (×4): 15 mg via ORAL
  Filled 2014-01-20 (×4): qty 1

## 2014-01-20 MED ORDER — SODIUM CHLORIDE 0.9 % IJ SOLN
PREFILLED_SYRINGE | INTRAMUSCULAR | Status: DC | PRN
  Administered 2014-01-20: 13:00:00

## 2014-01-20 MED ORDER — BUTAMBEN-TETRACAINE-BENZOCAINE 2-2-14 % EX AERO
INHALATION_SPRAY | CUTANEOUS | Status: DC | PRN
  Administered 2014-01-20: 2 via TOPICAL

## 2014-01-20 MED ORDER — MIDAZOLAM HCL 10 MG/2ML IJ SOLN
INTRAMUSCULAR | Status: DC | PRN
  Administered 2014-01-20 (×2): 1 mg via INTRAVENOUS

## 2014-01-20 MED ORDER — HYDROCODONE-ACETAMINOPHEN 5-325 MG PO TABS
1.0000 | ORAL_TABLET | Freq: Once | ORAL | Status: AC
  Administered 2014-01-20: 1 via ORAL
  Filled 2014-01-20: qty 1

## 2014-01-20 MED ORDER — FENTANYL CITRATE 0.05 MG/ML IJ SOLN
INTRAMUSCULAR | Status: AC
  Filled 2014-01-20: qty 2

## 2014-01-20 MED ORDER — INFLUENZA VAC SPLIT QUAD 0.5 ML IM SUSY
0.5000 mL | PREFILLED_SYRINGE | INTRAMUSCULAR | Status: DC
  Filled 2014-01-20: qty 0.5

## 2014-01-20 MED ORDER — ATENOLOL 25 MG PO TABS
25.0000 mg | ORAL_TABLET | Freq: Every day | ORAL | Status: DC
  Administered 2014-01-20 – 2014-01-23 (×4): 25 mg via ORAL
  Filled 2014-01-20 (×4): qty 1

## 2014-01-20 NOTE — Interval H&P Note (Signed)
History and Physical Interval Note:  01/20/2014 12:27 PM  Sabrina Pickleatherine H Richins  has presented today for surgery, with the diagnosis of melena; GI bleeding  The various methods of treatment have been discussed with the patient and family. After consideration of risks, benefits and other options for treatment, the patient has consented to  Procedure(s): ESOPHAGOGASTRODUODENOSCOPY (EGD) (N/A) as a surgical intervention .  The patient's history has been reviewed, patient examined, no change in status, stable for surgery.  I have reviewed the patient's chart and labs.  Questions were answered to the patient's satisfaction.     Einar Nolasco C.

## 2014-01-20 NOTE — H&P (View-Only) (Signed)
Referring Provider: Dr. Susie Cassette Primary Care Physician:  Kimber Relic, MD Primary Gastroenterologist:  Gentry Fitz  Reason for Consultation:  GI bleed  HPI: Sabrina Bishop is a 80 y.o. female admitted for shortness of breath and weakness that has occurred over the last 2 weeks. Chart history reports melena intermittently for 6 weeks but the patient denies having any black tarry stools stating that her stools have been dark brown over the last several weeks except for one episode of red blood per rectum last Tuesday. Denies dizziness, nausea, vomiting, abdominal pain, heartburn. Has felt dyspnea on exertion that she says is not new for her. No history of peptic ulcer disease. Lost about 10 pounds in the past 6 months. Hgb 7 (10 on 01/10/14). Heme positive. Hypotensive in the 90's systolic. On Eliquis for Afib. Has never had a colonoscopy.   Past Medical History  Diagnosis Date  . Hypothyroidism   . OA (osteoarthritis)   . HTN (hypertension)   . Diastolic dysfunction   . AV BLOCK, COMPLETE 03/26/2009    Qualifier: Diagnosis of  By: Graciela Husbands, MD, Ruthann Cancer Ty Hilts   . Pacemaker-mdt DDD 12/16/2010  . Keratoconus, unspecified   . Disturbance of salivary secretion   . GERD (gastroesophageal reflux disease)   . Osteoarthrosis, unspecified whether generalized or localized, unspecified site   . Pain in joint, ankle and foot   . Spinal stenosis, lumbar region, without neurogenic claudication   . Lumbago   . Trigger finger (acquired)   . Abnormality of gait   . Edema   . Dysphagia, unspecified(787.20)   . Seborrheic keratosis 05/2012  . Other atopic dermatitis and related conditions 03/2012  . Unspecified diastolic heart failure 02/16/2012  . Mixed hyperlipidemia 08/2011  . Chronic kidney disease, stage II (mild) 08/2011  . Hypoxemia 08/2011  . Enthesopathy of hip region 01/2011  . Keratoconjunctivitis 2011  . Atrial fibrillation 02/15/2013  . Unspecified venous (peripheral) insufficiency  2010  . Pain in joint, shoulder region 2015    left  . Keratoacanthoma of hand 2015    left  . Actinic keratoses 2015    right arm and left cheek    Past Surgical History  Procedure Laterality Date  . Total knee arthroplasty  2005    left  . Abdominal hysterectomy  1950  . Pacemaker generator change  04/2012    Dr. Graciela Husbands  . Pacemaker placement  2004  . Total knee arthroplasty  1995    right  . Cataract extraction w/ intraocular lens implant  2012    right    Prior to Admission medications   Medication Sig Start Date End Date Taking? Authorizing Provider  amoxicillin (AMOXIL) 500 MG capsule Take 2,000 mg by mouth once as needed (prior to dental appointments).   Yes Historical Provider, MD  apixaban (ELIQUIS) 2.5 MG TABS tablet Take 2.5 mg by mouth 2 (two) times daily.   Yes Historical Provider, MD  atenolol (TENORMIN) 50 MG tablet Take 50 mg by mouth daily.   Yes Historical Provider, MD  atorvastatin (LIPITOR) 10 MG tablet Take 10 mg by mouth daily.   Yes Historical Provider, MD  HYDROcodone-acetaminophen (NORCO) 7.5-325 MG per tablet Take 1 tablet by mouth every 4 (four) hours.   Yes Historical Provider, MD  isosorbide mononitrate (IMDUR) 30 MG 24 hr tablet Take 15 mg by mouth daily.   Yes Historical Provider, MD  levothyroxine (SYNTHROID, LEVOTHROID) 100 MCG tablet Take 100 mcg by mouth daily before breakfast.   Yes  Historical Provider, MD  losartan (COZAAR) 50 MG tablet Take 50 mg by mouth daily.   Yes Historical Provider, MD  naproxen sodium (ANAPROX) 220 MG tablet Take 220 mg by mouth 2 (two) times daily with a meal.   Yes Historical Provider, MD    Scheduled Meds: . [START ON 01/20/2014] levothyroxine  100 mcg Oral QAC breakfast  . [START ON 01/23/2014] pantoprazole (PROTONIX) IV  40 mg Intravenous Q12H  . sodium chloride  3 mL Intravenous Q12H  . sodium chloride  3 mL Intravenous Q12H  . sodium polystyrene  15 g Oral Once   Continuous Infusions: . sodium chloride    .  pantoprozole (PROTONIX) infusion     PRN Meds:.sodium chloride, acetaminophen, acetaminophen, levalbuterol, ondansetron (ZOFRAN) IV, ondansetron, sodium chloride, sodium chloride  Allergies as of 01/19/2014 - Review Complete 01/19/2014  Allergen Reaction Noted  . Vioxx [rofecoxib] Other (See Comments) 01/19/2014  . Adhesive [tape] Rash 02/15/2013    Family History  Problem Relation Age of Onset  . Hyperlipidemia    . Hypertension    . Osteoporosis    . Stroke Father   . Congestive Heart Failure Brother   . Congestive Heart Failure Brother     History   Social History  . Marital Status: Widowed    Spouse Name: N/A    Number of Children: N/A  . Years of Education: N/A   Occupational History  . Retired Child psychotherapist   .     Social History Main Topics  . Smoking status: Never Smoker   . Smokeless tobacco: Never Used  . Alcohol Use: No  . Drug Use: No  . Sexual Activity: No   Other Topics Concern  . Not on file   Social History Narrative   Lives at [facility] since 2005   Widowed   Pacemaker   Power wheelchair and cane when walking   Never smoked   Exercise none     Review of Systems: All negative except as stated above in HPI.  Physical Exam: Vital signs: Filed Vitals:   01/19/14 2011  BP: 92/33  Pulse: 62  Temp: 97.4 F (36.3 C)  Resp: 18     General:   Elderly, Alert,  Well-developed, well-nourished, pleasant and cooperative in NAD HEENT: anicteric Neck: nontender, supple Lungs:  Clear throughout to auscultation.   No wheezes, crackles, or rhonchi. No acute distress. Heart:  Regular rate and rhythm; no murmurs, clicks, rubs,  or gallops. Abdomen: minimal epigastric tenderness without guarding, soft, nondistended, +BS  Rectal:  Deferred Ext: Trace LE edema; tender to touch bilaterally  GI:  Lab Results:  Recent Labs  01/19/14 1323  WBC 8.4  HGB 7.0*  HCT 21.6*  PLT 219   BMET  Recent Labs  01/18/14 01/19/14 1323  NA 133*  135*  K 5.6* 5.4*  CL  --  101  CO2  --  22  GLUCOSE  --  140*  BUN 60* 56*  CREATININE 1.8* 1.89*  CALCIUM  --  8.9   LFT No results found for this basename: PROT, ALBUMIN, AST, ALT, ALKPHOS, BILITOT, BILIDIR, IBILI,  in the last 72 hours PT/INR  Recent Labs  01/19/14 1555  LABPROT 20.5*  INR 1.76*     Studies/Results: Dg Chest 2 View  01/19/2014   CLINICAL DATA:  Weakness.  Shortness of breath.  Swelling in legs.  EXAM: CHEST  2 VIEW  COMPARISON:  12/07/2013.  FINDINGS: Mediastinum hilar structures are normal. Cardiac pacer  and lead tips in right atrium and right ventricle. Cardiomegaly with normal pulmonary vascularity. Right base subsegmental atelectasis. Small bilateral pleural effusions. No pneumothorax. No acute bony abnormality. Diffuse osteopenia degenerative changes of the thoracolumbar spine with multiple stable lumbar compression fractures.  IMPRESSION: 1. Stable cardiomegaly. Pulmonary vascularity is normal. Cardiac pacer noted with lead tips in the right atrium and right ventricle. 2. Mild bibasilar subsegmental axis with small bilateral pleural effusions. 3. Stable lumbar compression fractures. Diffuse thoracolumbar spine osteopenia and degenerative change.   Electronically Signed   By: Maisie Fushomas  Register   On: 01/19/2014 15:01    Impression/Plan: 80 yo with anemia, heme positive stool, and question of melena but has had a 3 g drop in Hgb in less than 2 weeks. No evidence of an ongoing GI bleed. Needs volume resuscitation, IV PPI Q 12 hours. EGD in AM to look for a source of her GI bleed. Will hold off on setting up for a colonoscopy unless anemia persists despite blood transfusions or visible bleeding develops due to her advanced age and multiple comorbidities.    LOS: 0 days   Tekeya Geffert C.  01/19/2014, 8:17 PM

## 2014-01-20 NOTE — ED Provider Notes (Signed)
Medical screening examination/treatment/procedure(s) were conducted as a shared visit with non-physician practitioner(s) and myself.  I personally evaluated the patient during the encounter.   EKG Interpretation   Date/Time:  Friday January 19 2014 13:24:24 EDT Ventricular Rate:  60 PR Interval:  208 QRS Duration: 132 QT Interval:  404 QTC Calculation: 404 R Axis:   -76 Text Interpretation:  A-V dual-paced rhythm with some inhibition No  further analysis attempted due to paced rhythm agree. Confirmed by  Donnald GarrePfeiffer, MD, Lebron ConnersMarcy [phone]([id number]) on 01/19/2014 4:28:15 PM     See blank note  Arby BarretteMarcy Crystin Lechtenberg, MD 01/20/14 [id number]

## 2014-01-20 NOTE — Progress Notes (Signed)
[facility] TEAM 1 - Stepdown/ICU TEAM Progress Note  Rodman PickleCatherine H Evinger RUE:[id number] DOB: 03/19/1917 DOA: 01/19/2014 PCP: Kimber RelicGREEN, ARTHUR G, MD  Admit HPI / Brief Narrative: 80 year old female who presented with shortness of breath and weakness worsening over 2 weeks.  She complained of several episodes of dark brown stools on and off for the 6 weeks. Patient was found to be anemic with a hemoglobin decline from 10 > 7 over 9 days. She was on eliquis for atrial fibrillation, and regular NSAID for pain. She had never had a colonoscopy. She does not have a SolicitorGastroenterologist. Patient recently had a 2-D echo at her cardiologist's office which showed normal systolic function. Creatinine had gone up to 2.3 from a baseline of 1.7-1.8 and she was asked to discontinue Lasix.   HPI/Subjective: Pt is alert and pleasant.  She denies cp, n/v, or abdom pain.  She does report lethargy and DOE.  She explains to me that she has a living will and would not wish to undergo intubation, mech vent, CPR, or defib.    Assessment/Plan:  GIB - ?melena vs/ occult bleeding  For EGD today   Acute blood loss anemia on chronic anemia  Hgb nadir 7.0 - now s/p 2U PRBC w/ favorable response - follow   Chronic atrial fibrillation on eliquis Rate controlled - anticoag on hold due to GIB   Chronic kidney disease, stage III creatinine baseline appears to be ~1.6-1.8 - follow w/ volume expansion/transfusion   Hyperkalemia Was dosed w/ kayexalate - follow trend - not high enough to require further action at this time   Hypothyroidism  TSH markedly elevated at 10.5, but was 2.01 12/28/2013 - will check FT4 before adjusting tx   Chronic Diastolic CHF  Appears well compensated at this time   Equivocal UA No abx for now - f/u cx   Code Status: FULL Family Communication: no family present at time of exam Disposition Plan: SDU pending results of EGD  Consultants: GI  Procedures: EGD - pending  10/3  Antibiotics: none  DVT prophylaxis: SCDs  Objective: Blood pressure 133/57, pulse 58, temperature 97.9 F (36.6 C), temperature source Oral, resp. rate 15, height 5' (1.524 m), weight 61.2 kg (134 lb 14.7 oz), SpO2 100.00%.  Intake/Output Summary (Last 24 hours) at 01/20/14 1029 Last data filed at 01/20/14 0800  Gross per 24 hour  Intake   1970 ml  Output    250 ml  Net   1720 ml   Exam: General: No acute respiratory distress Lungs: Clear to auscultation bilaterally without wheezes or crackles Cardiovascular: Regular rate and rhythm w/ 2/6 holosystolic M Abdomen: Nontender, nondistended, soft, bowel sounds positive, no rebound, no ascites, no appreciable mass Extremities: No significant cyanosis, clubbing, or edema bilateral lower extremities  Data Reviewed: Basic Metabolic Panel:  Recent Labs Lab 01/18/14 01/19/14 1323 01/20/14 0720  NA 133* 135* 136*  K 5.6* 5.4* 5.6*  CL  --  101 104  CO2  --  22 17*  GLUCOSE  --  140* 111*  BUN 60* 56* 51*  CREATININE 1.8* 1.89* 1.87*  CALCIUM  --  8.9 8.5    Liver Function Tests:  Recent Labs Lab 01/20/14 0720  AST 86*  ALT 52*  ALKPHOS 89  BILITOT 0.6  PROT 6.3  ALBUMIN 3.0*   No results found for this basename: LIPASE, AMYLASE,  in the last 168 hours No results found for this basename: AMMONIA,  in the last 168 hours  Coags:  Recent  Labs Lab 01/19/14 1555  INR 1.76*   CBC:  Recent Labs Lab 01/19/14 1323 01/20/14 0545  WBC 8.4 8.3  HGB 7.0* 11.4*  HCT 21.6* 33.1*  MCV 95.6 88.3  PLT 219 227    Cardiac Enzymes:  Recent Labs Lab 01/19/14 2036 01/20/14 0545  TROPONINI <0.30 <0.30    Recent Results (from the past 240 hour(s))  MRSA PCR SCREENING     Status: None   Collection Time    01/19/14  6:36 PM      Result Value Ref Range Status   MRSA by PCR NEGATIVE  NEGATIVE Final   Comment:            The GeneXpert MRSA Assay (FDA     approved for NASAL specimens     only), is one  component of a     comprehensive MRSA colonization     surveillance program. It is not     intended to diagnose MRSA     infection nor to guide or     monitor treatment for     MRSA infections.     Studies:  Recent x-ray studies have been reviewed in detail by the Attending Physician  Scheduled Meds:  Scheduled Meds: . atenolol  25 mg Oral Daily  . HYDROcodone-acetaminophen  1 tablet Oral Q4H while awake  . isosorbide mononitrate  15 mg Oral Daily  . levothyroxine  100 mcg Oral QAC breakfast  . [START ON 01/23/2014] pantoprazole (PROTONIX) IV  40 mg Intravenous Q12H  . sodium chloride  3 mL Intravenous Q12H  . sodium chloride  3 mL Intravenous Q12H    Time spent on care of this patient: 35 mins   Lawren Sexson T , MD   [facility] Office  [phone] Pager - Text Page per Loretha Stapler as per below:  On-Call/Text Page:      [email or web address]      password [email or web address]  If 7PM-7AM, please contact night-coverage [email or web address] Password [email or web address] 01/20/2014, 10:29 AM   LOS: 1 day

## 2014-01-20 NOTE — Progress Notes (Signed)
INITIAL NUTRITION ASSESSMENT  DOCUMENTATION CODES Per approved criteria  -Not Applicable   INTERVENTION: Add Ensure Complete po daily, each supplement provides 350 kcal and 13 grams of protein, once diet allows. RD to continue to follow nutrition care plan.  NUTRITION DIAGNOSIS: Inadequate oral intake related to poor appetite as evidenced by patient report.   Goal: Intake to meet >90% of estimated nutrition needs.  Monitor:  weight trends, lab trends, I/O's, PO intake, diet advancement, supplement tolerance  Reason for Assessment: Malnutrition Screening Tool  80 y.o. female  Admitting Dx: GIB  ASSESSMENT: PMHx significant for CKD stage III, hypothyroidism, HTN. Admitted with SOB and weakness x 2 weeks. Work-up reveals GIB.  Patient reports 7-10 lb wt loss x 5 months. Per GI H&P, pt denies any melena, but did have heme positive stool. Plan is to hold off on colonoscopy unless absolutely indicated given her comorbidities and advanced age.  Pt is currently NPO. She reports poor appetite x 4 months, however her weight has remained relatively stable during this time. Has tried Boost and enjoys it. States that she feels as though her appetite has been poor since starting Eliquis, about 4 months ago. Pt reports that she doesn't skip meals, she is just eating less during her meals, for example - she will only eat 1/4 to 1/2 of a sandwich instead of the whole thing.  Nutrition-focused physical exam is WNL. Pt with some muscle/fat mass loss 2/2 advanced age.  Potassium elevated at 5.6  Height: Ht Readings from Last 1 Encounters:  01/20/14 5' (1.524 m)    Weight: Wt Readings from Last 1 Encounters:  01/20/14 134 lb 14.7 oz (61.2 kg)    Ideal Body Weight: 100 lb  % Ideal Body Weight: 134%  Wt Readings from Last 30 Encounters:  01/20/14 134 lb 14.7 oz (61.2 kg)  01/20/14 134 lb 14.7 oz (61.2 kg)  01/16/14 135 lb (61.236 kg)  01/10/14 131 lb 3.2 oz (59.512 kg)  01/02/14 134  lb 8 oz (61.009 kg)  12/07/13 137 lb 3.2 oz (62.234 kg)  11/07/13 137 lb (62.143 kg)  06/27/13 140 lb (63.504 kg)  02/15/13 141 lb 12.8 oz (64.32 kg)  11/29/12 139 lb (63.05 kg)  08/30/12 139 lb (63.05 kg)  07/28/12 142 lb 12.8 oz (64.774 kg)  05/02/12 138 lb (62.596 kg)  05/02/12 138 lb (62.596 kg)  04/29/12 139 lb (63.05 kg)  12/08/11 138 lb (62.596 kg)  12/16/10 138 lb 1.9 oz (62.651 kg)  11/12/09 144 lb (65.318 kg)  03/26/09 147 lb (66.679 kg)  01/17/09 148 lb (67.132 kg)  10/15/08 148 lb (67.132 kg)  08/13/08 146 lb (66.225 kg)  06/12/08 150 lb (68.04 kg)  02/28/08 149 lb (67.586 kg)  01/16/08 149 lb (67.586 kg)  11/24/07 146 lb (66.225 kg)  08/30/07 141 lb (63.957 kg)  05/31/07 140 lb (63.504 kg)  03/29/07 137 lb 8 oz (62.37 kg)  12/23/06 136 lb (61.689 kg)   Usual Body Weight: 135-145 lb  % Usual Body Weight: 96%  BMI:  Body mass index is 26.35 kg/(m^2). Overweight  Estimated Nutritional Needs: Kcal: 1400 - 1600 Protein: 50 - 60 g Fluid: at least 1.5 liters daily  Skin: intact  Diet Order: NPO  EDUCATION NEEDS: -Education not appropriate at this time   Intake/Output Summary (Last 24 hours) at 01/20/14 0844 Last data filed at 01/20/14 0800  Gross per 24 hour  Intake   1970 ml  Output    250 ml  Net  1720 ml    Last BM: 10/3  Labs:   Recent Labs Lab 01/18/14 01/19/14 1323 01/20/14 0720  NA 133* 135* 136*  K 5.6* 5.4* 5.6*  CL  --  101 104  CO2  --  22 17*  BUN 60* 56* 51*  CREATININE 1.8* 1.89* 1.87*  CALCIUM  --  8.9 8.5  GLUCOSE  --  140* 111*    CBG (last 3)  No results found for this basename: GLUCAP,  in the last 72 hours  Scheduled Meds: . levothyroxine  100 mcg Oral QAC breakfast  . [START ON 01/23/2014] pantoprazole (PROTONIX) IV  40 mg Intravenous Q12H  . sodium chloride  3 mL Intravenous Q12H  . sodium chloride  3 mL Intravenous Q12H    Continuous Infusions: . sodium chloride    . sodium chloride 20 mL/hr at 01/20/14  0352  . pantoprozole (PROTONIX) infusion 8 mg/hr (01/19/14 2153)    Past Medical History  Diagnosis Date  . Hypothyroidism   . OA (osteoarthritis)   . HTN (hypertension)   . Diastolic dysfunction   . AV BLOCK, COMPLETE 03/26/2009    Qualifier: Diagnosis of  By: Graciela Husbands, MD, Ruthann Cancer Ty Hilts   . Pacemaker-mdt DDD 12/16/2010  . Keratoconus, unspecified   . Disturbance of salivary secretion   . GERD (gastroesophageal reflux disease)   . Osteoarthrosis, unspecified whether generalized or localized, unspecified site   . Pain in joint, ankle and foot   . Spinal stenosis, lumbar region, without neurogenic claudication   . Lumbago   . Trigger finger (acquired)   . Abnormality of gait   . Edema   . Dysphagia, unspecified(787.20)   . Seborrheic keratosis 05/2012  . Other atopic dermatitis and related conditions 03/2012  . Unspecified diastolic heart failure 02/16/2012  . Mixed hyperlipidemia 08/2011  . Chronic kidney disease, stage II (mild) 08/2011  . Hypoxemia 08/2011  . Enthesopathy of hip region 01/2011  . Keratoconjunctivitis 2011  . Atrial fibrillation 02/15/2013  . Unspecified venous (peripheral) insufficiency 2010  . Pain in joint, shoulder region 2015    left  . Keratoacanthoma of hand 2015    left  . Actinic keratoses 2015    right arm and left cheek    Past Surgical History  Procedure Laterality Date  . Total knee arthroplasty  2005    left  . Abdominal hysterectomy  1950  . Pacemaker generator change  04/2012    Dr. Graciela Husbands  . Pacemaker placement  2004  . Total knee arthroplasty  1995    right  . Cataract extraction w/ intraocular lens implant  2012    right    Jarold Motto MS, RD, LDN Inpatient Registered Dietitian After-hours pager: [phone]

## 2014-01-20 NOTE — Op Note (Signed)
[facility] [address] [place], [id number]   ENDOSCOPY PROCEDURE REPORT  PATIENT: Sabrina Bishop, Sabrina H  MR#: [id number] BIRTHDATE: June 29, 1916 , 89  yrs. old GENDER: female ENDOSCOPIST: Charlott RakesVincent Zainab Crumrine, MD REFERRED BY:  hospital team PROCEDURE DATE:  01/20/2014 PROCEDURE:  EGD w/ control of bleeding ASA CLASS:     Class III INDICATIONS:  melena and iron deficiency anemia. MEDICATIONS: Fentanyl 25 mcg IV, Versed 2 mg IV, and Epinephrine:saline 1:10,000 U  9 cc injected submucosally TOPICAL ANESTHETIC: Cetacaine Spray  DESCRIPTION OF PROCEDURE: After the risks benefits and alternatives of the procedure were thoroughly explained, informed consent was obtained.  The Pentax Gastroscope [id number] endoscope was introduced through the mouth and advanced to the second portion of the duodenum , Without limitations.  The instrument was slowly withdrawn as the mucosa was fully examined.    Endoscope was inserted into the oropharynx and advanced into the esophagus where superficial ulcers and edema were noted in the distal esophagus consistent with moderated - severe erosive esophagitis. A small Mallory-Weiss tear (MWT) was noted that was not bleeding on insertion but started bleeding during the procedure. In the body of the stomach were 3 linear superficial ulcers (approximate range 1 cm long to 1.5 cm long and widest ulcer was 5 mm wide) that were clean-based and surrounding edema noted. The gastric mucosa was edematous in the body of the stomach.  The duodenal bulb was intubated and a 4 mm clean-based superficial ulcer was seen with surrounding edema and erythema. The second portion of the duodenum was normal in appearance.     Retroflexion revealed fresh blood coming from the GEJ and a small hiatal hernia was noted. The GEJ was again inspected and the MWT was now bleeding and slowed down with 6 cc of Epinephrine:saline 1:10,000 U injected submucosally.  When the bleeding did not stop and additional 3 cc of XBJ:YNWGNFepi:saline mixture was injected, which helped but bleeding continued to slowly ooze. A 7 French Bicap gold probe was then used to cauterize the Mallory-Weiss tear and this technique was successful and complete hemostasis was achieved.          The scope was then withdrawn from the patient and the procedure completed.  COMPLICATIONS: There were no immediate complications.  ENDOSCOPIC IMPRESSION:     1. Bleeding Mallory Weiss tear - s/p epi injection and cautery (hemostasis achieved- see above) 2. Large Nonbleeding Gastric Ulcers 3. Distal Erosive Esophagitis (moderate-severe) 4. Nonbleeding Duodenal Ulcer   RECOMMENDATIONS:     Protonix infusion; NPO; Avoid anticoagulation at this time if possible; Follow H/Hs   eSigned:  Charlott RakesVincent Mena Simonis, MD 01/20/2014 1:24 PM    CC:  CPT CODES: ICD CODES:  The ICD and CPT codes recommended by this software are interpretations from the data that the clinical staff has captured with the software.  The verification of the translation of this report to the ICD and CPT codes and modifiers is the sole responsibility of the health care institution and practicing physician where this report was generated.  [employer] will not be held responsible for the validity of the ICD and CPT codes included on this report.  AMA assumes no liability for data contained or not contained herein. CPT is a Publishing rights managerregistered trademark of the [employer].  PATIENT NAME:  Sabrina Bishop, Sabrina H MR#: [id number]

## 2014-01-20 NOTE — Brief Op Note (Signed)
Bleeding Mallory Weiss tear - hemostasis achieved with cautery and epi injection (see endopro note for details); Large gastric ulcers (nonbleeding); Duodenal ulcer. Would avoid anticoagulation for at least 2 weeks if possible. Continue Protonix infusion. NPO except ice chips.

## 2014-01-21 LAB — CBC
HCT: 32.2 % — ABNORMAL LOW (ref 36.0–46.0)
HEMOGLOBIN: 10.7 g/dL — AB (ref 12.0–15.0)
MCH: 28.9 pg (ref 26.0–34.0)
MCHC: 33.2 g/dL (ref 30.0–36.0)
MCV: 87 fL (ref 78.0–100.0)
Platelets: 184 10*3/uL (ref 150–400)
RBC: 3.7 MIL/uL — ABNORMAL LOW (ref 3.87–5.11)
RDW: 16.7 % — ABNORMAL HIGH (ref 11.5–15.5)
WBC: 7.3 10*3/uL (ref 4.0–10.5)

## 2014-01-21 LAB — COMPREHENSIVE METABOLIC PANEL
ALT: 36 U/L — ABNORMAL HIGH (ref 0–35)
ANION GAP: 15 (ref 5–15)
AST: 45 U/L — ABNORMAL HIGH (ref 0–37)
Albumin: 2.7 g/dL — ABNORMAL LOW (ref 3.5–5.2)
Alkaline Phosphatase: 73 U/L (ref 39–117)
BILIRUBIN TOTAL: 0.5 mg/dL (ref 0.3–1.2)
BUN: 43 mg/dL — AB (ref 6–23)
CHLORIDE: 104 meq/L (ref 96–112)
CO2: 17 meq/L — AB (ref 19–32)
CREATININE: 1.68 mg/dL — AB (ref 0.50–1.10)
Calcium: 8.3 mg/dL — ABNORMAL LOW (ref 8.4–10.5)
GFR calc Af Amer: 29 mL/min — ABNORMAL LOW (ref >90)
GFR, EST NON AFRICAN AMERICAN: 25 mL/min — AB (ref >90)
GLUCOSE: 82 mg/dL (ref 70–99)
Potassium: 5 mEq/L (ref 3.7–5.3)
Sodium: 136 mEq/L — ABNORMAL LOW (ref 137–147)
Total Protein: 5.5 g/dL — ABNORMAL LOW (ref 6.0–8.3)

## 2014-01-21 LAB — TYPE AND SCREEN
ABO/RH(D): O POS
Antibody Screen: NEGATIVE
UNIT DIVISION: 0
Unit division: 0

## 2014-01-21 LAB — T4, FREE: Free T4: 1.49 ng/dL (ref 0.80–1.80)

## 2014-01-21 MED ORDER — TRAMADOL HCL 50 MG PO TABS
50.0000 mg | ORAL_TABLET | Freq: Four times a day (QID) | ORAL | Status: DC | PRN
Start: 2014-01-21 — End: 2014-01-21

## 2014-01-21 MED ORDER — SODIUM CHLORIDE 0.9 % IV SOLN
500.0000 mg | Freq: Every day | INTRAVENOUS | Status: AC
  Administered 2014-01-21 – 2014-01-22 (×2): 500 mg via INTRAVENOUS
  Filled 2014-01-21 (×3): qty 10

## 2014-01-21 MED ORDER — TRAMADOL HCL 50 MG PO TABS: 50.0000 mg | ORAL_TABLET | Freq: Two times a day (BID) | ORAL | Status: DC | PRN

## 2014-01-21 MED ORDER — SODIUM CHLORIDE 0.9 % IV SOLN
25.0000 mg | Freq: Once | INTRAVENOUS | Status: AC
  Administered 2014-01-21: 25 mg via INTRAVENOUS
  Filled 2014-01-21: qty 0.5

## 2014-01-21 NOTE — Progress Notes (Signed)
Patient ID: Sabrina Bishop, female   DOB: [birth year], 80 y.o.   MRN: [id number] [facility] Progress Note  Sabrina PickleCatherine H Bishop 80 y.o. [birth year]   Subjective: Sitting up in chair with clear liquid tray. Denies abdominal pain.  Objective: Vital signs in last 24 hours: Filed Vitals:   01/21/14 0700  BP: 116/47  Pulse: 60  Temp: 97.4 F (36.3 C)  Resp: 12    Physical Exam: Gen: alert, no acute distress, elderly, frail, pleasant Abd: soft, nontender, nondistended  Lab Results:  Recent Labs  01/20/14 0720 01/21/14 0235  NA 136* 136*  K 5.6* 5.0  CL 104 104  CO2 17* 17*  GLUCOSE 111* 82  BUN 51* 43*  CREATININE 1.87* 1.68*  CALCIUM 8.5 8.3*    Recent Labs  01/20/14 0720 01/21/14 0235  AST 86* 45*  ALT 52* 36*  ALKPHOS 89 73  BILITOT 0.6 0.5  PROT 6.3 5.5*  ALBUMIN 3.0* 2.7*    Recent Labs  01/20/14 1805 01/21/14 0235  WBC 8.4 7.3  HGB 11.7* 10.7*  HCT 34.9* 32.2*  MCV 86.8 87.0  PLT 184 184    Recent Labs  01/19/14 1555  LABPROT 20.5*  INR 1.76*      Assessment/Plan: GI bleed - EGD yesterday with bleeding Mallory Weiss tear. Also had nonbleeding gastric ulcers and a nonbleeding duodenal ulcer. S/P cautery and epi injection to MWT with hemostasis achieved. Hgb 10.7 (11.7). No evidence of further bleeding. Clear liquids only today and if stable then ok to slowly advance tomorrow. Continue Protonix infusion. [facility] to follow.   Sabrina Bishop C. 01/21/2014, 12:45 PM

## 2014-01-21 NOTE — Progress Notes (Signed)
[place] TEAM 1 - Stepdown/ICU TEAM Progress Note  Sabrina Bishop ZOX:[id number] DOB: 1917/01/22 DOA: 01/19/2014 PCP: Kimber Relic, MD  Admit HPI / Brief Narrative: 80 year old female who presented with shortness of breath and weakness worsening over 2 weeks.  She complained of several episodes of dark brown stools on and off for the 6 weeks. Patient was found to be anemic with a hemoglobin decline from 10 > 7 over 9 days. She was on eliquis for atrial fibrillation, and regular NSAID for pain. She had never had a colonoscopy. She does not have a Solicitor. Patient recently had a 2-D echo at her cardiologist's office which showed normal systolic function. Creatinine had gone up to 2.3 from a baseline of 1.7-1.8 and she was asked to discontinue Lasix.   GI was consulted, and the pt underwent EGD on 10/3 revealing a bleeding Mallory Weiss tear (hemostasis achieved with cautery and epi injection), large gastric ulcers (nonbleeding), and a duodenal ulcer.  Her Hgb is being monitored, s/p 2U PRBC.   HPI/Subjective: Pt has no new complaints today.  States she feels weak in general.  No abdom pain, chest pain, n/v, or sob.    Assessment/Plan:  Multifocal UGIB EGD 10/3 noted a bleeding mallory weiss tear, large gastric ulcers, and a duodenal ulcer - tear was injected w/ epi and cauterized - ulcers were not bleeding - will need to avoid NSAIDs (discussed w/ pt)  Acute blood loss anemia on chronic anemia  Hgb nadir 7.0 - s/p 2U PRBC w/ favorable response - follow trend - modest drop over night likely due to volume expansion/equilibration - will load w/ IV Fe given significant Fe deficiency   Chronic atrial fibrillation on eliquis Rate controlled - GI suggests holding anticoag for at least 2 weeks - discussed this w/ pt, who understands risk of CVA, but is in favor, and would like to consider not resuming it at all - encouraged her to discuss this w/ her PCP in f/u   Chronic kidney  disease, stage III creatinine baseline appears to be ~1.6-1.8 - improved to baseline w/ volume expansion/transfusion   Hyperkalemia Was dosed w/ kayexalate - resolved    Hypothyroidism  TSH markedly elevated at 10.5, but was 2.01 12/28/2013 - FT4 pending    Chronic Diastolic CHF  Appears well compensated at this time   Equivocal UA No abx for now - f/u cx (re-ordered after apparently cancelled 10/3)  Code Status: FULL Family Communication: no family present at time of exam Disposition Plan: stable for transfer to medical bed - begin PT/OT - slowly advance diet (clear liquids only for today) - return to SNF in 24-48hrs  Consultants: GI  Procedures: EGD - 10/3  Antibiotics: none  DVT prophylaxis: SCDs  Objective: Blood pressure 116/47, pulse 60, temperature 97.4 F (36.3 C), temperature source Oral, resp. rate 12, height 5' (1.524 m), weight 61.2 kg (134 lb 14.7 oz), SpO2 98.00%.  Intake/Output Summary (Last 24 hours) at 01/21/14 8119 Last data filed at 01/21/14 0900  Gross per 24 hour  Intake 863.33 ml  Output    900 ml  Net -36.67 ml   Exam: General: No acute respiratory distress Lungs: Clear to auscultation bilaterally without wheezes or crackles Cardiovascular: Regular rate and rhythm w/ stable 2/6 holosystolic M Abdomen: Nontender, nondistended, soft, bowel sounds positive, no rebound, no ascites, no appreciable mass Extremities: No significant cyanosis, clubbing, or edema bilateral lower extremities  Data Reviewed: Basic Metabolic Panel:  Recent Labs Lab 01/18/14 01/19/14  1323 01/20/14 0720 01/21/14 0235  NA 133* 135* 136* 136*  K 5.6* 5.4* 5.6* 5.0  CL  --  101 104 104  CO2  --  22 17* 17*  GLUCOSE  --  140* 111* 82  BUN 60* 56* 51* 43*  CREATININE 1.8* 1.89* 1.87* 1.68*  CALCIUM  --  8.9 8.5 8.3*    Liver Function Tests:  Recent Labs Lab 01/20/14 0720 01/21/14 0235  AST 86* 45*  ALT 52* 36*  ALKPHOS 89 73  BILITOT 0.6 0.5  PROT 6.3  5.5*  ALBUMIN 3.0* 2.7*   Coags:  Recent Labs Lab 01/19/14 1555  INR 1.76*   CBC:  Recent Labs Lab 01/19/14 1323 01/20/14 0545 01/20/14 1805 01/21/14 0235  WBC 8.4 8.3 8.4 7.3  HGB 7.0* 11.4* 11.7* 10.7*  HCT 21.6* 33.1* 34.9* 32.2*  MCV 95.6 88.3 86.8 87.0  PLT 219 227 184 184    Cardiac Enzymes:  Recent Labs Lab 01/19/14 2036 01/20/14 0545  TROPONINI <0.30 <0.30    Recent Results (from the past 240 hour(s))  MRSA PCR SCREENING     Status: None   Collection Time    01/19/14  6:36 PM      Result Value Ref Range Status   MRSA by PCR NEGATIVE  NEGATIVE Final   Comment:            The GeneXpert MRSA Assay (FDA     approved for NASAL specimens     only), is one component of a     comprehensive MRSA colonization     surveillance program. It is not     intended to diagnose MRSA     infection nor to guide or     monitor treatment for     MRSA infections.     Studies:  Recent x-ray studies have been reviewed in detail by the Attending Physician  Scheduled Meds:  Scheduled Meds: . atenolol  25 mg Oral Daily  . HYDROcodone-acetaminophen  1 tablet Oral Q4H while awake  . Influenza vac split quadrivalent PF  0.5 mL Intramuscular Tomorrow-1000  . isosorbide mononitrate  15 mg Oral Daily  . levothyroxine  100 mcg Oral QAC breakfast  . [START ON 01/23/2014] pantoprazole (PROTONIX) IV  40 mg Intravenous Q12H  . sodium chloride  3 mL Intravenous Q12H  . sodium chloride  3 mL Intravenous Q12H    Time spent on care of this patient: 35 mins   Dayln Tugwell T , MD   [facility] Office  [phone] Pager - Text Page per Loretha StaplerAmion as per below:  On-Call/Text Page:      [email or web address]      password [email or web address]  If 7PM-7AM, please contact night-coverage [email or web address] Password [email or web address] 01/21/2014, 9:39 AM   LOS: 2 days

## 2014-01-22 ENCOUNTER — Encounter (HOSPITAL_COMMUNITY): Payer: Self-pay | Admitting: Gastroenterology

## 2014-01-22 LAB — CBC
HCT: 34.5 % — ABNORMAL LOW (ref 36.0–46.0)
HEMOGLOBIN: 11.2 g/dL — AB (ref 12.0–15.0)
MCH: 28.6 pg (ref 26.0–34.0)
MCHC: 32.5 g/dL (ref 30.0–36.0)
MCV: 88.2 fL (ref 78.0–100.0)
Platelets: 175 10*3/uL (ref 150–400)
RBC: 3.91 MIL/uL (ref 3.87–5.11)
RDW: 16.4 % — ABNORMAL HIGH (ref 11.5–15.5)
WBC: 6.6 10*3/uL (ref 4.0–10.5)

## 2014-01-22 LAB — BASIC METABOLIC PANEL
Anion gap: 14 (ref 5–15)
BUN: 38 mg/dL — AB (ref 6–23)
CHLORIDE: 105 meq/L (ref 96–112)
CO2: 17 mEq/L — ABNORMAL LOW (ref 19–32)
Calcium: 8.5 mg/dL (ref 8.4–10.5)
Creatinine, Ser: 1.62 mg/dL — ABNORMAL HIGH (ref 0.50–1.10)
GFR calc Af Amer: 30 mL/min — ABNORMAL LOW (ref >90)
GFR, EST NON AFRICAN AMERICAN: 26 mL/min — AB (ref >90)
GLUCOSE: 93 mg/dL (ref 70–99)
POTASSIUM: 5.1 meq/L (ref 3.7–5.3)
SODIUM: 136 meq/L — AB (ref 137–147)

## 2014-01-22 MED ORDER — SODIUM CHLORIDE 0.9 % IV SOLN
8.0000 mg/h | INTRAVENOUS | Status: DC
  Administered 2014-01-22: 8 mg/h via INTRAVENOUS

## 2014-01-22 MED ORDER — PANTOPRAZOLE SODIUM 40 MG PO TBEC
40.0000 mg | DELAYED_RELEASE_TABLET | Freq: Every day | ORAL | Status: DC
  Administered 2014-01-22 – 2014-01-23 (×2): 40 mg via ORAL
  Filled 2014-01-22 (×2): qty 1

## 2014-01-22 MED ORDER — SODIUM CHLORIDE 0.9 % IV SOLN: 8.0000 mg/h | INTRAVENOUS | Status: DC

## 2014-01-22 NOTE — Progress Notes (Signed)
Subjective: No melena. Tolerating clear liquids. No abdominal pain.  Objective: Vital signs in last 24 hours: Temp:  [97.4 F (36.3 C)-98.4 F (36.9 C)] 98.4 F (36.9 C) (10/05 0451) Pulse Rate:  [63-77] 63 (10/05 0451) Resp:  [18-19] 19 (10/05 0451) BP: (130-150)/(56-77) 130/56 mmHg (10/05 0451) SpO2:  [99 %-100 %] 99 % (10/05 0451) Weight change:  Last BM Date: 01/20/14  PE: GEN:  NAD, much younger-appearing than stated age ABD:  Soft, non-tender, hyperactive bowel sounds  Lab Results: CBC    Component Value Date/Time   WBC 6.6 01/22/2014 0035   WBC 4.5 11/09/2013   RBC 3.91 01/22/2014 0035   RBC 2.26* 01/19/2014 2036   RBC 3.45* 07/28/2012 1605   HGB 11.2* 01/22/2014 0035   HCT 34.5* 01/22/2014 0035   PLT 175 01/22/2014 0035   MCV 88.2 01/22/2014 0035   MCH 28.6 01/22/2014 0035   MCH 31.3 07/28/2012 1605   MCHC 32.5 01/22/2014 0035   MCHC 32.1 07/28/2012 1605   RDW 16.4* 01/22/2014 0035   RDW 13.5 07/28/2012 1605   LYMPHSABS 0.9 01/10/2014 1541   LYMPHSABS 1.1 07/28/2012 1605   MONOABS 0.9 01/10/2014 1541   EOSABS 0.0 01/10/2014 1541   EOSABS 0.0 07/28/2012 1605   BASOSABS 0.0 01/10/2014 1541   BASOSABS 0.0 07/28/2012 1605   CMP     Component Value Date/Time   NA 136* 01/22/2014 0035   NA 133* 01/18/2014   K 5.1 01/22/2014 0035   CL 105 01/22/2014 0035   CO2 17* 01/22/2014 0035   GLUCOSE 93 01/22/2014 0035   BUN 38* 01/22/2014 0035   BUN 60* 01/18/2014   CREATININE 1.62* 01/22/2014 0035   CREATININE 1.8* 01/18/2014   CALCIUM 8.5 01/22/2014 0035   PROT 5.5* 01/21/2014 0235   ALBUMIN 2.7* 01/21/2014 0235   AST 45* 01/21/2014 0235   ALT 36* 01/21/2014 0235   ALKPHOS 73 01/21/2014 0235   BILITOT 0.5 01/21/2014 0235   GFRNONAA 26* 01/22/2014 0035   GFRAA 30* 01/22/2014 0035   Assessment:  1.  Melena.  Likely from NSAID-related ulcers and esophagitis. 2.  Anemia. 3.  Mallory-Weiss tear.  Iatrogenic (during procedure) seems most likely, since hematemesis wasn't part of patient's  initial presentation.  Quiescent at present.  Plan:  1.  Transition to oral pantoprazole; given age > 2765 and use of blood thinners, patient will need to continue on PPI indefinitely (dosing pantoprazole 40 mg po qd). 2.  No NSAIDs. 3.  Advance diet as tolerated. 4.  Agree with need for PT/OT evaluation. 5.  From GI perspective, if no further bleeding, patient will likely be able to be discharged tomorrow. 5.  [facility] will follow.   Sharief Wainwright M 01/22/2014, 9:15 AM

## 2014-01-22 NOTE — Care Management Note (Signed)
CARE MANAGEMENT NOTE 01/22/2014  Patient:  Sabrina Bishop, Sabrina Bishop   Account Number:  [id number]  Date Initiated:  01/22/2014  Documentation initiated by:  Aviah Sorci  Subjective/Objective Assessment:   CM following for progression and d/c planning.     Action/Plan:   Met with pt who is resident of [place] her own appartment ,independent living. States that she understands that she may need to return to SNF level of care for short term. Concerned about 3 day stay.   Anticipated DC Date:     Anticipated DC Plan:  SKILLED NURSING FACILITY         Choice offered to / List presented to:             Status of service:  Completed, signed off Medicare Important Message given?  YES (If response is "NO", the following Medicare IM given date fields will be blank) Date Medicare IM given:  01/22/2014 Medicare IM given by:  Rana Hochstein Date Additional Medicare IM given:   Additional Medicare IM given by:    Discharge Disposition:  [place]  Per UR Regulation:    If discussed at Long Length of Stay Meetings, dates discussed:    Comments:

## 2014-01-22 NOTE — Evaluation (Signed)
Physical Therapy Evaluation Patient Details Name: Sabrina Bishop MRN: [id number] DOB: November 10, 1916 Today's Date: 01/22/2014   History of Present Illness  80 year old female who presented with shortness of breath and weakness worsening over 2 weeks. She complained of several episodes of dark brown stools on and off for the 6 weeks. Patient was found to be anemic with a hemoglobin decline from 10 > 7 over 9 days. GI was consulted, and the pt underwent EGD on 10/3 revealing a bleeding Mallory Weiss tear (hemostasis achieved with cautery and epi injection), large gastric ulcers (nonbleeding), and a duodenal ulcer. Her Hgb is being monitored, s/p 2U PRBC.   Clinical Impression   Pt admitted with above. Pt currently with functional limitations due to the deficits listed below (see PT Problem List).  Pt will benefit from skilled PT to increase their independence and safety with mobility to allow discharge to the venue listed below.   Given how short of breath pt became with in-room activity, a short-term SNF stay to maximize independence and safety with mobility prior to going back to her independent living apt      Follow Up Recommendations SNF;Supervision/Assistance - 24 hour     Equipment Recommendations  3in1 (PT) (Worth considering a rollator RW)    Recommendations for Other Services       Precautions / Restrictions        Mobility  Bed Mobility                  Transfers Overall transfer level: Needs assistance Equipment used: Rolling walker (2 wheeled) Transfers: Sit to/from Stand Sit to Stand: Min guard         General transfer comment: Cues for hand placement; stood from recliner and commode; dependent on UE support/push; but not needing physical assist  Ambulation/Gait Ambulation/Gait assistance: Min guard Ambulation Distance (Feet): 20 Feet (x2) Assistive device: Rolling walker (2 wheeled) Gait Pattern/deviations: Step-through pattern;Trunk flexed Gait  velocity: slowed   General Gait Details: Dependent on UE support on RW  Stairs            Wheelchair Mobility    Modified Rankin (Stroke Patients Only)       Balance                                             Pertinent Vitals/Pain Pain Assessment: No/denies pain O2 sats 93% post amb on Room Air    Home Living Family/patient expects to be discharged to::  ([facility] apt)               Home Equipment: Environmental consultant - 2 wheels;Wheelchair - power      Prior Function Level of Independence: Independent with assistive device(s)         Comments: walks with Rw to dining hall; when she's feeling weak, she uses her motorized wc     Hand Dominance        Extremity/Trunk Assessment   Upper Extremity Assessment: Overall WFL for tasks assessed           Lower Extremity Assessment: Generalized weakness         Communication   Communication: No difficulties  Cognition Arousal/Alertness: Awake/alert Behavior During Therapy: WFL for tasks assessed/performed Overall Cognitive Status: Within Functional Limits for tasks assessed  General Comments      Exercises        Assessment/Plan    PT Assessment Patient needs continued PT services  PT Diagnosis Difficulty walking;Generalized weakness   PT Problem List Decreased strength;Decreased activity tolerance;Decreased balance;Decreased mobility;Decreased knowledge of use of DME;Cardiopulmonary status limiting activity  PT Treatment Interventions DME instruction;Gait training;Functional mobility training;Therapeutic activities;Therapeutic exercise;Balance training;Patient/family education   PT Goals (Current goals can be found in the Care Plan section) Acute Rehab PT Goals Patient Stated Goal: Hopes to be able to get home soon PT Goal Formulation: With patient Time For Goal Achievement: 01/29/14 Potential to Achieve Goals: Good     Frequency Min 3X/week   Barriers to discharge        Co-evaluation               End of Session Equipment Utilized During Treatment: Gait belt Activity Tolerance: Patient tolerated treatment well Patient left: in chair;with call bell/phone within reach;with chair alarm set Nurse Communication: Mobility status         Time: [id number] PT Time Calculation (min): 18 min   Charges:   PT Evaluation $Initial PT Evaluation Tier I: 1 Procedure PT Treatments $Gait Training: 8-22 mins   PT G Codes:          Olen PelGarrigan, Carlie Corpus Hamff 01/22/2014, 12:47 PM  Van ClinesHolly Aristidis [place], [place]  Acute Rehabilitation Services Pager [phone] Office [phone]

## 2014-01-22 NOTE — Progress Notes (Signed)
[place] TEAM 1 - Stepdown/ICU TEAM Progress Note  Sabrina Bishop OZH:[id number] DOB: May 13, 1916 DOA: 01/19/2014 PCP: Kimber RelicGREEN, ARTHUR G, MD  Admit HPI / Brief Narrative: 80 year old female who presented with shortness of breath and weakness worsening over 2 weeks.  She complained of several episodes of dark brown stools on and off for the 6 weeks. Patient was found to be anemic with a hemoglobin decline from 10 > 7 over 9 days. She was on eliquis for atrial fibrillation, and regular NSAID for pain. She had never had a colonoscopy. She does not have a SolicitorGastroenterologist. Patient recently had a 2-D echo at her Cardiologist's office which showed normal systolic function. Creatinine had gone up to 2.3 from a baseline of 1.7-1.8 and she was asked to discontinue Lasix.   GI was consulted, and the pt underwent EGD on 10/3 revealing a bleeding Mallory Weiss tear (hemostasis achieved with cautery and epi injection), large gastric ulcers (nonbleeding), and a duodenal ulcer.  Her Hgb is being monitored, s/p 2U PRBC.   HPI/Subjective: Pt has been up w/ therapy.  She felt weak when doing so, an somewhat SOB, but her sats remained >90%.  She is tolerating her liquid diet thus far.  She denies cp, n/v, or abdom pain.     Assessment/Plan:  Multifocal UGIB EGD 10/3 noted a bleeding mallory weiss tear, large gastric ulcers, and a duodenal ulcer - tear was injected w/ epi and cauterized - ulcers were not bleeding - will need to avoid NSAIDs (discussed w/ pt) - to continue protonix indefinitely - advance diet today - if tolerates well, and cleared for same by PT/OT, will plan for return to SNF 10/6  Acute blood loss anemia on chronic anemia  Hgb nadir 7.0 - s/p 2U PRBC w/ favorable response - follow trend - loaded w/ IV Fe given significant Fe deficiency - Hgb improving/stable    Chronic atrial fibrillation on eliquis Rate controlled - GI suggests holding anticoag for at least 2 weeks - discussed this w/ pt,  who understands risk of CVA, but is in favor and would like to consider not resuming it at all - encouraged her to discuss this w/ her PCP in f/u   Chronic kidney disease, stage III creatinine baseline appears to be ~1.6-1.8 - improved to baseline w/ volume expansion/transfusion   Hyperkalemia Was dosed w/ kayexalate - resolved    Hypothyroidism  TSH markedly elevated at 10.5, but was 2.01 12/28/2013 - FT4 normal - will not adjust synthroid, and recommend f/u TSH in 8-10 weeks    Chronic Diastolic CHF  Appears well compensated at this time   Equivocal UA No abx for now - f/u cx (re-ordered after apparently cancelled 10/3)  Code Status: FULL Family Communication: no family present at time of exam Disposition Plan: PT/OT - advance diet - return to SNF in 24hrs probable   Consultants: GI  Procedures: EGD - 10/3  Antibiotics: none  DVT prophylaxis: SCDs  Objective: Blood pressure 137/74, pulse 61, temperature 97.9 F (36.6 C), temperature source Oral, resp. rate 20, height 5' (1.524 m), weight 61.2 kg (134 lb 14.7 oz), SpO2 100.00%.  Intake/Output Summary (Last 24 hours) at 01/22/14 1148 Last data filed at 01/22/14 1000  Gross per 24 hour  Intake    965 ml  Output    500 ml  Net    465 ml   Exam: General: No acute respiratory distress Lungs: Clear to auscultation bilaterally without wheezes or crackles Cardiovascular: Regular rate and  rhythm w/ stable 2/6 holosystolic M Abdomen: Nontender, nondistended, soft, bowel sounds +, no rebound, no ascites, no appreciable mass Extremities: No significant cyanosis, clubbing, or edema bilateral lower extremities  Data Reviewed: Basic Metabolic Panel:  Recent Labs Lab 01/18/14 01/19/14 1323 01/20/14 0720 01/21/14 0235 01/22/14 0035  NA 133* 135* 136* 136* 136*  K 5.6* 5.4* 5.6* 5.0 5.1  CL  --  101 104 104 105  CO2  --  22 17* 17* 17*  GLUCOSE  --  140* 111* 82 93  BUN 60* 56* 51* 43* 38*  CREATININE 1.8* 1.89* 1.87*  1.68* 1.62*  CALCIUM  --  8.9 8.5 8.3* 8.5    Liver Function Tests:  Recent Labs Lab 01/20/14 0720 01/21/14 0235  AST 86* 45*  ALT 52* 36*  ALKPHOS 89 73  BILITOT 0.6 0.5  PROT 6.3 5.5*  ALBUMIN 3.0* 2.7*   Coags:  Recent Labs Lab 01/19/14 1555  INR 1.76*   CBC:  Recent Labs Lab 01/19/14 1323 01/20/14 0545 01/20/14 1805 01/21/14 0235 01/22/14 0035  WBC 8.4 8.3 8.4 7.3 6.6  HGB 7.0* 11.4* 11.7* 10.7* 11.2*  HCT 21.6* 33.1* 34.9* 32.2* 34.5*  MCV 95.6 88.3 86.8 87.0 88.2  PLT 219 227 184 184 175    Cardiac Enzymes:  Recent Labs Lab 01/19/14 2036 01/20/14 0545  TROPONINI <0.30 <0.30    Recent Results (from the past 240 hour(s))  MRSA PCR SCREENING     Status: None   Collection Time    01/19/14  6:36 PM      Result Value Ref Range Status   MRSA by PCR NEGATIVE  NEGATIVE Final   Comment:            The GeneXpert MRSA Assay (FDA     approved for NASAL specimens     only), is one component of a     comprehensive MRSA colonization     surveillance program. It is not     intended to diagnose MRSA     infection nor to guide or     monitor treatment for     MRSA infections.     Studies:  Recent x-ray studies have been reviewed in detail by the Attending Physician  Scheduled Meds:  Scheduled Meds: . atenolol  25 mg Oral Daily  . HYDROcodone-acetaminophen  1 tablet Oral Q4H while awake  . Influenza vac split quadrivalent PF  0.5 mL Intramuscular Tomorrow-1000  . iron dextran (INFED/DEXFERRRUM) 500 MG IVPB  500 mg Intravenous Daily  . isosorbide mononitrate  15 mg Oral Daily  . levothyroxine  100 mcg Oral QAC breakfast  . pantoprazole  40 mg Oral Daily    Time spent on care of this patient: 35 mins   Kenyata Napier T , MD   [facility] Office  [phone] Pager - Text Page per Loretha Stapler as per below:  On-Call/Text Page:      [email or web address]      password [email or web address]  If 7PM-7AM, please contact night-coverage [email or web address] Password  [email or web address] 01/22/2014, 11:48 AM   LOS: 3 days

## 2014-01-22 NOTE — Clinical Social Work Psychosocial (Signed)
Clinical Social Work Department BRIEF PSYCHOSOCIAL ASSESSMENT 01/22/2014  Patient:  Sabrina Bishop,Sabrina Bishop     Account Number:  [id number]     Admit date:  01/19/2014  Clinical Social Worker:  Delmer IslamRAWFORD,Macrae Wiegman, LCSW  Date/Time:  01/22/2014 04:48 AM  Referred by:  Physician  Date Referred:  01/22/2014 Referred for  SNF Placement   Other Referral:   Interview type:  Patient Other interview type:    PSYCHOSOCIAL DATA Living Status:  ALONE Admitted from facility:   Level of care:   Primary support name:  Sabrina Bishop Primary support relationship to patient:  CHILD, ADULT Degree of support available:   Patient has another son Sabrina CanalesSteve and daughter Sabrina Bishop. Patient mentioned her sons while talking with CSW.    CURRENT CONCERNS Current Concerns  Post-Acute Placement   Other Concerns:    SOCIAL WORK ASSESSMENT / PLAN CSW talked with patient about discharge planning. Mrs. Skousen alert and oriented and was very receptive  [facility]  [facility]. Before talking to patient, CSW [facility] and confirmed that they can take patient in the Healthcare at discharge. CSW informed of  conversation with Sabrina CelesteJanie. CSW and patient also talked [facility]; ambulance versus someone coming to get her. Patient mentioned her son during the conversation as a possible transportation option.   Assessment/plan status:  Psychosocial Support/Ongoing Assessment of Needs Other assessment/ plan:   Information/referral to community resources:   None needed or requested at this time.    PATIENT'S/FAMILY'S RESPONSE TO PLAN OF CARE: Patient receptive to talking with CSW about [facility].

## 2014-01-22 NOTE — Progress Notes (Signed)
Patient ID: Sabrina Bishop, female   DOB: [birth year], 80 y.o.   MRN: [id number]    [facility]     [facility] of Service: Clinic (12)    Allergies  Allergen Reactions  . Vioxx [Rofecoxib] Other (See Comments)    Pt doesn't remember  . Adhesive [Tape] Rash    Chief Complaint  Patient presents with  . Medical Management of Chronic Issues    per Dr. Graciela Husbands wants Dr. Chilton Si to follow up on kidney functions. Stopped diuretics.  Where with daughter-in-law Britta Mccreedy, family wants to know  1.) why irregular heart rhythm has gone on so long , 2) what are GFR levels, 3.) bruise on upper left arm 4. ) is UTI clear.     HPI:  Diarrhea: a few loose stools  Essential hypertension: controlled  Dyspnea on exertion: persistent. Feeling terrible with this. SOB when walking across the room.  Paroxysmal atrial fibrillation:   DIASTOLIC HEART FAILURE, CHRONIC: recently seen by Dr. Graciela Husbands. LVEF 60%  Spinal stenosis, lumbar region, without neurogenic claudication: chronic low back discomfort has not changed.    Medications: Patient's Medications  New Prescriptions   No medications on file  Previous Medications   AMOXICILLIN (AMOXIL) 500 MG CAPSULE    Take 2,000 mg by mouth once as needed (prior to dental appointments).   APIXABAN (ELIQUIS) 2.5 MG TABS TABLET    Take 2.5 mg by mouth 2 (two) times daily.   ATENOLOL (TENORMIN) 50 MG TABLET    Take 50 mg by mouth daily.   ATORVASTATIN (LIPITOR) 10 MG TABLET    Take 10 mg by mouth daily.   HYDROCODONE-ACETAMINOPHEN (NORCO) 7.5-325 MG PER TABLET    Take 1 tablet by mouth every 4 (four) hours.   ISOSORBIDE MONONITRATE (IMDUR) 30 MG 24 HR TABLET    Take 15 mg by mouth daily.   LEVOTHYROXINE (SYNTHROID, LEVOTHROID) 100 MCG TABLET    Take 100 mcg by mouth daily before breakfast.   LOSARTAN (COZAAR) 50 MG TABLET    Take 50 mg by mouth daily.   NAPROXEN SODIUM (ANAPROX) 220 MG TABLET    Take 220 mg by mouth 2 (two) times daily  with a meal.  Modified Medications   No medications on file  Discontinued Medications   APIXABAN (ELIQUIS) 2.5 MG TABS TABLET    One twice daily for anticoagulation to prevent stroke   ATENOLOL (TENORMIN) 50 MG TABLET    Take one tablet by mouth once daily for blood pressure   ATORVASTATIN (LIPITOR) 10 MG TABLET    Take one tablet by mouth once daily for cholesterol   HYDROCODONE-ACETAMINOPHEN (NORCO) 7.5-325 MG PER TABLET    Take one tablet every 4 hours as needed for pain   ISOSORBIDE MONONITRATE (IMDUR) 30 MG 24 HR TABLET    Take 1/2 tablet by mouth once daily to prevent angina   LEVOTHYROXINE (SYNTHROID, LEVOTHROID) 100 MCG TABLET    One daily for thyroid supplement   LOSARTAN (COZAAR) 50 MG TABLET    Take one tablet by mouth once daily for blood pressure     Review of Systems  Constitutional: Positive for activity change and fatigue.  HENT: Positive for hearing loss and tinnitus. Negative for congestion and ear pain.   Eyes: Negative.   Respiratory: Positive for shortness of breath.   Cardiovascular: Positive for leg swelling. Negative for chest pain and palpitations.  Gastrointestinal: Negative for abdominal pain and abdominal distention.  Endocrine: Negative.   Genitourinary: Positive for frequency.  Musculoskeletal: Positive for arthralgias, back pain and gait problem. Negative for neck stiffness.       Poor balance  Skin:       Large keratoacanthoma left hand dorsum. Several patches of likely actinic keratoses at right upper arm and left cheek.  Neurological: Positive for weakness. Negative for dizziness, tremors and syncope.  Hematological: Negative.   Psychiatric/Behavioral: Negative for behavioral problems, confusion and agitation.    Filed Vitals:   01/16/14 0850  BP: 118/62  Pulse: 64  Temp: 97.7 F (36.5 C)  TempSrc: Oral  Weight: 135 lb (61.236 kg)  SpO2: 98%   Body mass index is 26.37 kg/(m^2).  Physical Exam  Constitutional: She is oriented to person,  place, and time. No distress.  frail  HENT:  Head: Normocephalic and atraumatic.  Partial deafness.  Eyes: Pupils are equal, round, and reactive to light.  Neck: Normal range of motion. Neck supple. No JVD present. No tracheal deviation present. No thyromegaly present.  Cardiovascular: Normal rate, regular rhythm, normal heart sounds and intact distal pulses.  Exam reveals no gallop and no friction rub.   No murmur heard. Pulmonary/Chest: Effort normal. No respiratory distress. She has no wheezes. She has rales. She exhibits no tenderness.  Abdominal: Bowel sounds are normal. She exhibits no distension and no mass. There is no tenderness.  Musculoskeletal: Normal range of motion. She exhibits edema. She exhibits no tenderness.  Using power chair and cane.  Lymphadenopathy:    She has no cervical adenopathy.  Neurological: She is alert and oriented to person, place, and time. No cranial nerve deficit.  Skin: Skin is warm and dry. No rash noted. No erythema. No pallor.  Senile ecchymoses.  Psychiatric: She has a normal mood and affect. Her behavior is normal. Thought content normal.     Labs reviewed: Office Visit on 01/10/2014  Component Date Value Ref Range Status  . Pro B Natriuretic peptide (BNP) 01/10/2014 449.0* 0.0 - 100.0 pg/mL Final  . Sodium 01/10/2014 133* 135 - 145 mEq/L Final  . Potassium 01/10/2014 4.5  3.5 - 5.1 mEq/L Final  . Chloride 01/10/2014 98  96 - 112 mEq/L Final  . CO2 01/10/2014 26  19 - 32 mEq/L Final  . Glucose, Bld 01/10/2014 111* 70 - 99 mg/dL Final  . BUN 16/01/9603 54* 6 - 23 mg/dL Final  . Creatinine, Ser 01/10/2014 2.3* 0.4 - 1.2 mg/dL Final  . Total Bilirubin 01/10/2014 0.8  0.2 - 1.2 mg/dL Final  . Alkaline Phosphatase 01/10/2014 54  39 - 117 U/L Final  . AST 01/10/2014 26  0 - 37 U/L Final  . ALT 01/10/2014 15  0 - 35 U/L Final  . Total Protein 01/10/2014 6.7  6.0 - 8.3 g/dL Final  . Albumin 54/12/8117 3.7  3.5 - 5.2 g/dL Final  . Calcium  14/78/2956 9.1  8.4 - 10.5 mg/dL Final  . GFR 21/30/8657 21.25* >60.00 mL/min Final  . WBC 01/10/2014 6.4  4.0 - 10.5 K/uL Final  . RBC 01/10/2014 3.23* 3.87 - 5.11 Mil/uL Final  . Hemoglobin 01/10/2014 10.0* 12.0 - 15.0 g/dL Final  . HCT 84/69/6295 29.9* 36.0 - 46.0 % Final  . MCV 01/10/2014 92.4  78.0 - 100.0 fl Final  . MCHC 01/10/2014 33.6  30.0 - 36.0 g/dL Final  . RDW 28/41/3244 14.2  11.5 - 15.5 % Final  . Platelets 01/10/2014 185.0  150.0 - 400.0 K/uL Final  . Neutrophils Relative % 01/10/2014 71.7  43.0 - 77.0 % Final  .  Lymphocytes Relative 01/10/2014 13.8  12.0 - 46.0 % Final  . Monocytes Relative 01/10/2014 14.4* 3.0 - 12.0 % Final  . Eosinophils Relative 01/10/2014 0.0  0.0 - 5.0 % Final  . Basophils Relative 01/10/2014 0.1  0.0 - 3.0 % Final  . Neutro Abs 01/10/2014 4.6  1.4 - 7.7 K/uL Final  . Lymphs Abs 01/10/2014 0.9  0.7 - 4.0 K/uL Final  . Monocytes Absolute 01/10/2014 0.9  0.1 - 1.0 K/uL Final  . Eosinophils Absolute 01/10/2014 0.0  0.0 - 0.7 K/uL Final  . Basophils Absolute 01/10/2014 0.0  0.0 - 0.1 K/uL Final  . Pulse Generator Manufacturer 01/10/2014 Medtronic   Final  . Pulse Gen Model 01/10/2014 [id number] Sensia   Final  . Pulse Gen Serial Number 01/10/2014 [id number] H   Final  . RV Sense Sensitivity 01/10/2014 2.8   Final  . RA Pace Amplitude 01/10/2014 2   Final  . RV Pace PulseWidth 01/10/2014 0.4   Final  . RV Pace Amplitude 01/10/2014 2.5   Final  . RA Impedance 01/10/2014 518   Final  . RA Amplitude 01/10/2014 0.25   Final  . RV IMPEDANCE 01/10/2014 464   Final  . RV Pacing Amplitude 01/10/2014 0.75   Final  . RV Pacing PulseWidth 01/10/2014 0.4   Final  . Battery Longevity 01/10/2014 8.5 years   Final  . Battery Voltage 01/10/2014 2.78   Final  . Battery Impedance 01/10/2014 153   Final  . Brady AP VP Percent 01/10/2014 59.6   Final  . Huston FoleyBrady AS VP Percent 01/10/2014 40.4   Final  . Huston FoleyBrady AP VS Percent 01/10/2014 0.1*  Final  . Brady AS VS Percent  01/10/2014 0.1*  Final  . Eval Rhythm 01/10/2014 AFVP 30   Final  . Miscellaneous Comment 01/10/2014    Final                   Value:Pacemaker check in clinic. Normal device function. Threshold, sensing, impedances consistent with previous measurements. Device programmed to maximize longevity.  Persistant AF since >=May 2015 (38.9%) + Eliquis. No high ventricular rates noted. Device                          programmed at appropriate safety margins. Histogram distribution appropriate for patient activity level. Device programmed to optimize intrinsic conduction. Estimated longevity 8.5 years. Patient will follow up via [facility] on 12-28 and with [facility] in 12                          months.  Nursing Home on 01/02/2014  Component Date Value Ref Range Status  . Glucose 12/28/2013 94   Final  . BUN 12/28/2013 43* 4 - 21 mg/dL Final  . Creatinine 95/18/841609/01/2014 1.6* 0.5 - 1.1 mg/dL Final  . Potassium 60/63/016009/01/2014 4.2  3.4 - 5.3 mmol/L Final  . Sodium 12/28/2013 136* 137 - 147 mmol/L Final  . TSH 12/28/2013 2.01  0.41 - 5.90 uIU/mL Final  Clinical Support on 11/23/2013  Component Date Value Ref Range Status  . Date Time Interrogation Session 11/27/2013 [id number]   Final  . Pulse Generator Manufacturer 11/27/2013 Medtronic   Final  . Pulse Gen Model 11/27/2013 [id number] Jana HalfSensia   Final  . Pulse Gen Serial Number 11/27/2013 [id number] H   Final  . RV Sense Sensitivity 11/27/2013 2.8   Final  . RA Pace Amplitude 11/27/2013 2   Final  .  RV Pace PulseWidth 11/27/2013 0.4   Final  . RV Pace Amplitude 11/27/2013 2.5   Final  . RA Impedance 11/27/2013 519   Final  . RA Amplitude 11/27/2013 0.7   Final  . RA Pacing Amplitude 11/27/2013 0.875   Final  . RA Pacing PulseWidth 11/27/2013 0.4   Final  . RV IMPEDANCE 11/27/2013 464   Final  . RV Pacing Amplitude 11/27/2013 0.625   Final  . RV Pacing PulseWidth 11/27/2013 0.4   Final  . Battery Status 11/27/2013 Unknown   Final  . Battery Longevity 11/27/2013 105    Final  . Battery Voltage 11/27/2013 2.79   Final  . Battery Impedance 11/27/2013 153   Final  . Brady AP VP Percent 11/27/2013 60   Final  . Huston Foley AS VP Percent 11/27/2013 40   Final  . Huston Foley AP VS Percent 11/27/2013 0   Final  . Huston Foley AS VS Percent 11/27/2013 0   Final  . Eval Rhythm 11/27/2013 A-fib   Final  . Miscellaneous Comment 11/27/2013    Final                   Value:Pacemaker remote check. Device function reviewed. Impedance, sensing, auto capture thresholds consistent with previous measurements. Histograms appropriate for patient and level of activity. All other diagnostic data reviewed and is appropriate and                          stable for patient. Real time/magnet EGM shows appropriate sensing and capture. A-fib since 09/11/13, + eliquis. Total burden 25.8%.   No ventricular high rate episodes. Estimated longevity 8.5 years. Plan to follow in 3 months remotely, to see in office                          annually.  ROV in November with Dr. Graciela Husbands.  Abstract on 11/09/2013  Component Date Value Ref Range Status  . Hemoglobin 11/09/2013 11.8* 12.0 - 16.0 g/dL Final  . HCT 16/01/9603 36  36 - 46 % Final  . Platelets 11/09/2013 177  150 - 399 K/L Final  . WBC 11/09/2013 4.5   Final  . Glucose 11/09/2013 103   Final  . BUN 11/09/2013 43* 4 - 21 mg/dL Final  . Creatinine 54/12/8117 1.7* 0.5 - 1.1 mg/dL Final  . Potassium 14/78/2956 4.1  3.4 - 5.3 mmol/L Final  . Sodium 11/09/2013 138  137 - 147 mmol/L Final  . Alkaline Phosphatase 11/09/2013 63  25 - 125 U/L Final  . ALT 11/09/2013 41* 7 - 35 U/L Final  . AST 11/09/2013 36* 13 - 35 U/L Final  . TSH 11/09/2013 1.60  0.41 - 5.90 uIU/mL Final     Assessment/Plan 1. Diarrhea Seems to be clearing up  2. Essential hypertension controlled  3. Dyspnea on exertion worse  4. Paroxysmal atrial fibrillation Controlled ratte  5. DIASTOLIC HEART FAILURE, CHRONIC unchanged  6. Spinal stenosis, lumbar region, without neurogenic  claudication unchanged  7. Anemia, unspecified anemia type Last Hgb 10.0  8. CKD - Rising BUN and creatinine. Worse on diuretic. It was stopped prior to this visit. Will repeat renal panel and UA and culture

## 2014-01-22 NOTE — Evaluation (Signed)
Occupational Therapy Evaluation Patient Details Name: Sabrina Bishop MRN: [id number] DOB: December 29, 1916 Today's Date: 01/22/2014    History of Present Illness 80 year old female who presented with shortness of breath and weakness worsening over 2 weeks. She complained of several episodes of dark brown stools on and off for the 6 weeks. Patient was found to be anemic with a hemoglobin decline from 10 > 7 over 9 days. GI was consulted, and the pt underwent EGD on 10/3 revealing a bleeding Mallory Weiss tear (hemostasis achieved with cautery and epi injection), large gastric ulcers (nonbleeding), and a duodenal ulcer. Her Hgb is being monitored, s/p 2U PRBC.    Clinical Impression   Pt was performing simple meal prep and self care with AD prior to admission.  [facility]  Pt reports she has been increasingly more short of breath over the last several months. Pt continues to demonstrate decreased activity tolerance interfering with ability to perform ADL and ADL transfers, particularly in standing.  Pt will benefit from SNF level short term rehab prior to return home. Will defer further OT to SNF.    Follow Up Recommendations  SNF    Equipment Recommendations  None recommended by OT    Recommendations for Other Services       Precautions / Restrictions        Mobility Bed Mobility Overal bed mobility:  (pt up in chair)                Transfers Overall transfer level: Needs assistance Equipment used: Rolling walker (2 wheeled) Transfers: Sit to/from Stand Sit to Stand: Min guard         General transfer comment: Cues for hand placement; stood from recliner and commode; dependent on UE support/push; but not needing physical assist    Balance                                            ADL Overall ADL's : Needs assistance/impaired Eating/Feeding: Independent;Sitting   Grooming:  Wash/dry hands;Min guard;Standing   Upper Body Bathing: Set up;Sitting   Lower Body Bathing: Min guard;Sit to/from stand   Upper Body Dressing : Set up;Sitting   Lower Body Dressing: Min guard;Sit to/from stand   Toilet Transfer: Min guard;Ambulation;BSC   Toileting- ArchitectClothing Manipulation and Hygiene: Min guard;Sit to/from stand       Functional mobility during ADLs: Min guard;Rolling walker (within room) General ADL Comments: Pt primarily limited by decreased activity tolerance especially with standing and walking.     Vision                     Perception     Praxis      Pertinent Vitals/Pain Pain Assessment: No/denies pain     Hand Dominance Right   Extremity/Trunk Assessment Upper Extremity Assessment Upper Extremity Assessment: Overall WFL for tasks assessed   Lower Extremity Assessment Lower Extremity Assessment: Defer to PT evaluation       Communication Communication Communication: No difficulties   Cognition Arousal/Alertness: Awake/alert Behavior During Therapy: WFL for tasks assessed/performed Overall Cognitive Status: Within Functional Limits for tasks assessed                     General Comments       Exercises  Shoulder Instructions      Home Living Family/patient expects to be discharged to:: Skilled nursing facility                             Home Equipment: Dan Humphreys - 2 wheels;Wheelchair - power;Walker - 4 wheels;Grab bars - toilet;Grab bars - tub/shower;Shower seat - built in          Prior Functioning/Environment Level of Independence: Independent with assistive device(s)        Comments: walks with Rw to dining hall; when she's feeling weak, she uses her motorized wc    OT Diagnosis: Generalized weakness   OT Problem List: Decreased activity tolerance;Impaired balance (sitting and/or standing);Cardiopulmonary status limiting activity;Increased edema   OT Treatment/Interventions:       OT Goals(Current goals can be found in the care plan section) Acute Rehab OT Goals Patient Stated Goal: Hopes to be able to get home soon  OT Frequency:     Barriers to D/C:            Co-evaluation              End of Session    Activity Tolerance: Patient limited by fatigue Patient left: in chair;with call bell/phone within reach   Time: 1220-1245 OT Time Calculation (min): 25 min Charges:  OT General Charges $OT Visit: 1 Procedure OT Evaluation $Initial OT Evaluation Tier I: 1 Procedure OT Treatments $Self Care/Home Management : 8-22 mins G-Codes:    Evern Bio 01/22/2014, 1:19 PM [phone]

## 2014-01-23 DIAGNOSIS — E039 Hypothyroidism, unspecified: Secondary | ICD-10-CM

## 2014-01-23 DIAGNOSIS — I5032 Chronic diastolic (congestive) heart failure: Secondary | ICD-10-CM

## 2014-01-23 DIAGNOSIS — E875 Hyperkalemia: Secondary | ICD-10-CM

## 2014-01-23 DIAGNOSIS — I482 Chronic atrial fibrillation: Secondary | ICD-10-CM

## 2014-01-23 DIAGNOSIS — D62 Acute posthemorrhagic anemia: Secondary | ICD-10-CM

## 2014-01-23 DIAGNOSIS — K922 Gastrointestinal hemorrhage, unspecified: Secondary | ICD-10-CM

## 2014-01-23 DIAGNOSIS — N183 Chronic kidney disease, stage 3 (moderate): Secondary | ICD-10-CM

## 2014-01-23 LAB — BASIC METABOLIC PANEL
Anion gap: 14 (ref 5–15)
BUN: 36 mg/dL — ABNORMAL HIGH (ref 6–23)
CO2: 17 meq/L — AB (ref 19–32)
CREATININE: 1.95 mg/dL — AB (ref 0.50–1.10)
Calcium: 8.5 mg/dL (ref 8.4–10.5)
Chloride: 108 mEq/L (ref 96–112)
GFR calc Af Amer: 24 mL/min — ABNORMAL LOW (ref >90)
GFR calc non Af Amer: 21 mL/min — ABNORMAL LOW (ref >90)
GLUCOSE: 126 mg/dL — AB (ref 70–99)
POTASSIUM: 5.5 meq/L — AB (ref 3.7–5.3)
Sodium: 139 mEq/L (ref 137–147)

## 2014-01-23 LAB — CBC
HEMATOCRIT: 35.7 % — AB (ref 36.0–46.0)
HEMOGLOBIN: 11.4 g/dL — AB (ref 12.0–15.0)
MCH: 29.5 pg (ref 26.0–34.0)
MCHC: 31.9 g/dL (ref 30.0–36.0)
MCV: 92.5 fL (ref 78.0–100.0)
Platelets: 174 10*3/uL (ref 150–400)
RBC: 3.86 MIL/uL — ABNORMAL LOW (ref 3.87–5.11)
RDW: 16.4 % — ABNORMAL HIGH (ref 11.5–15.5)
WBC: 6.5 10*3/uL (ref 4.0–10.5)

## 2014-01-23 LAB — URINE CULTURE: Colony Count: >=100000

## 2014-01-23 MED ORDER — LEVOTHYROXINE SODIUM 125 MCG PO TABS
125.0000 ug | ORAL_TABLET | Freq: Every day | ORAL | Status: DC
  Filled 2014-01-23: qty 1

## 2014-01-23 MED ORDER — FERROUS FUMARATE 325 (106 FE) MG PO TABS
1.0000 | ORAL_TABLET | Freq: Every day | ORAL | Status: DC
  Filled 2014-01-23: qty 1

## 2014-01-23 MED ORDER — LEVOTHYROXINE SODIUM 125 MCG PO TABS: 125.0000 ug | ORAL_TABLET | Freq: Every day | ORAL | Status: AC

## 2014-01-23 MED ORDER — ATENOLOL 50 MG PO TABS: 25.0000 mg | ORAL_TABLET | Freq: Every day | ORAL | Status: AC

## 2014-01-23 MED ORDER — VITAMIN C 500 MG PO TABS
500.0000 mg | ORAL_TABLET | Freq: Every day | ORAL | Status: DC
  Filled 2014-01-23: qty 1

## 2014-01-23 MED ORDER — ASCORBIC ACID 500 MG PO TABS: 500.0000 mg | ORAL_TABLET | Freq: Every day | ORAL | Status: DC

## 2014-01-23 MED ORDER — TRAMADOL HCL 50 MG PO TABS: 50.0000 mg | ORAL_TABLET | Freq: Two times a day (BID) | ORAL | Status: DC | PRN

## 2014-01-23 MED ORDER — FERROUS FUMARATE 325 (106 FE) MG PO TABS: 1.0000 | ORAL_TABLET | Freq: Every day | ORAL | Status: DC

## 2014-01-23 MED ORDER — ISOSORBIDE MONONITRATE ER 30 MG PO TB24: 15.0000 mg | ORAL_TABLET | Freq: Every day | ORAL | Status: AC

## 2014-01-23 MED ORDER — PANTOPRAZOLE SODIUM 40 MG PO TBEC: 40.0000 mg | DELAYED_RELEASE_TABLET | Freq: Every day | ORAL | Status: AC

## 2014-01-23 MED ORDER — SODIUM POLYSTYRENE SULFONATE 15 GM/60ML PO SUSP: 30.0000 g | Freq: Once | ORAL | Status: DC

## 2014-01-23 NOTE — Discharge Summary (Signed)
Physician Discharge Summary  Sabrina Bishop ZOX:[id number] DOB: 26-May-1916 DOA: 01/19/2014  PCP: Sabrina Bishop  Admit date: 01/19/2014 Discharge date: 01/23/2014  Time spent: 40 minutes  Recommendations for Outpatient Follow-up:  Multifocal UGIB  -EGD 10/3 noted a bleeding mallory weiss tear, large gastric ulcers, and a duodenal ulcer, tear was injected w/ epi and cauterized;ulcers were not bleeding  - Avoid NSAIDs (discussed w/ pt)  -Continue Protonix 40 mg daily   Acute blood loss anemia on chronic anemia  -Hgb nadir 7.0  - s/p 2U PRBC  -loaded w/ IV Fe given significant Fe deficiency; discharge on iron 160 mg daily + vitamin C 500 mg daily - Hgb stable   Chronic atrial fibrillation on eliquis  -Rate controlled  - GI suggests holding anticoag for at least 2 weeks - Followup with Cardiologist/PCP and discuss not restarting/restarting (patient would like to discontinue permanently)    Chronic kidney disease, stage III  -creatinine baseline appears to be ~1.6-1.8 - Creatinine remains slightly above baseline    Hyperkalemia  -Kayexalate 30 Bishop m prior to discharge  -Followup with PCP in one week to check electrolytes    Hypothyroidism  -TSH markedly elevated at 10.5, but was 2.01 12/28/2013 - FT4 normal  -Increase Synthroid to 125 mcg daily, recommend f/u TSH in 8-10 weeks   Chronic Diastolic CHF  Appears well compensated at this time     Discharge Diagnoses:  Active Problems:   Anemia   GI bleed   Discharge Condition: Stable  Diet recommendation: Regular  Filed Weights   01/20/14 0200 01/22/14 2146  Weight: 61.2 kg (134 lb 14.7 oz) 60.9 kg (134 lb 4.2 oz)    History of present illness:  80 year old WF PMHx HTN, diastolic CHF, complete AV block S/P pacemaker 12/16/2010,, atrial fibrillation, CAD, hypothyroidism. Presented with shortness of breath and weakness worsening over 2 weeks. She complained of several episodes of dark brown stools on and off for the  6 weeks. Patient was found to be anemic with a hemoglobin decline from 10 > 7 over 9 days. She was on eliquis for atrial fibrillation, and regular NSAID for pain. She had never had a colonoscopy. She does not have a SolicitorGastroenterologist. Patient recently had a 2-D echo at her Cardiologist's office which showed normal systolic function. Creatinine had gone up to 2.3 from a baseline of 1.7-1.8 and she was asked to discontinue Lasix.  GI was consulted, and the pt underwent EGD on 10/3 revealing a bleeding Mallory Weiss tear (hemostasis achieved with cautery and epi injection), large gastric ulcers (nonbleeding), and a duodenal ulcer. Her Hgb is being monitored, s/p 2U PRBC.    Procedures: 10/3 EGD;-Bleeding Mallory Weiss tear - s/p epi injection and cautery  -Large Nonbleeding Gastric Ulcers- Distal Erosive Esophagitis (moderate-severe)  -Nonbleeding Duodenal Ulcer    Consultations: Dr. Charlott RakesVincent Schooler (GI)    Antibiotics    Discharge Exam: Filed Vitals:   01/22/14 2146 01/23/14 0616 01/23/14 1013 01/23/14 1016  BP: 118/65 109/56 148/57 148/57  Pulse: 62 59 61 61  Temp: 97.7 F (36.5 C) 98.1 F (36.7 C)  97.5 F (36.4 C)  TempSrc: Oral Oral  Oral  Resp: 20 18  16   Height:      Weight: 60.9 kg (134 lb 4.2 oz)     SpO2: 98% 96%  100%    General: A./O. x4, NAD Cardiovascular: Irregular irregular rhythm and rate, negative murmurs rubs or gallops, Respiratory: Clear to auscultation bilateral  Discharge Instructions  Medication List    STOP taking these medications       ELIQUIS 2.5 MG Tabs tablet  Generic drug:  apixaban     losartan 50 MG tablet  Commonly known as:  COZAAR     naproxen sodium 220 MG tablet  Commonly known as:  ANAPROX      TAKE these medications       amoxicillin 500 MG capsule  Commonly known as:  AMOXIL  Take 2,000 mg by mouth once as needed (prior to dental appointments).     ascorbic acid 500 MG tablet  Commonly known as:  VITAMIN C   Take 1 tablet (500 mg total) by mouth daily.     atenolol 50 MG tablet  Commonly known as:  TENORMIN  Take 0.5 tablets (25 mg total) by mouth daily.     atorvastatin 10 MG tablet  Commonly known as:  LIPITOR  Take 10 mg by mouth daily.     ferrous fumarate 325 (106 FE) MG Tabs tablet  Commonly known as:  HEMOCYTE - 106 mg FE  Take 1 tablet (106 mg of iron total) by mouth daily.     HYDROcodone-acetaminophen 7.5-325 MG per tablet  Commonly known as:  NORCO  Take 1 tablet by mouth every 4 (four) hours.     isosorbide mononitrate 30 MG 24 hr tablet  Commonly known as:  IMDUR  Take 0.5 tablets (15 mg total) by mouth daily.     levothyroxine 125 MCG tablet  Commonly known as:  SYNTHROID, LEVOTHROID  Take 1 tablet (125 mcg total) by mouth daily before breakfast.  Start taking on:  01/24/2014     pantoprazole 40 MG tablet  Commonly known as:  PROTONIX  Take 1 tablet (40 mg total) by mouth daily.     traMADol 50 MG tablet  Commonly known as:  ULTRAM  Take 1 tablet (50 mg total) by mouth every 12 (twelve) hours as needed for severe pain.       Allergies  Allergen Reactions  . Vioxx [Rofecoxib] Other (See Comments)    Pt doesn't remember  . Adhesive [Tape] Rash   Follow-up Information   Follow up with GREEN, Lenon Curt, Bishop. Schedule an appointment as soon as possible for a visit in 1 week. [facility] followup; GI bleed, atrial fibrillation not on anticoagulant)    Specialty:  Internal Medicine   Contact information:   [address] Jeanella Anton [place] [address] [phone]       Follow up with Sherryl Manges, Bishop. Schedule an appointment as soon as possible for a visit in 2 weeks. (Followup GI bleed, atrial fibrillation. Eliquis stopped, but anticoagulate to resume?)    Specialty:  Cardiology   Contact information:   [address] [place] [address] [phone]        The results of significant diagnostics from this hospitalization (including imaging,  microbiology, ancillary and laboratory) are listed below for reference.    Significant Diagnostic Studies: Dg Chest 2 View  01/19/2014   CLINICAL DATA:  Weakness.  Shortness of breath.  Swelling in legs.  EXAM: CHEST  2 VIEW  COMPARISON:  12/07/2013.  FINDINGS: Mediastinum hilar structures are normal. Cardiac pacer and lead tips in right atrium and right ventricle. Cardiomegaly with normal pulmonary vascularity. Right base subsegmental atelectasis. Small bilateral pleural effusions. No pneumothorax. No acute bony abnormality. Diffuse osteopenia degenerative changes of the thoracolumbar spine with multiple stable lumbar compression fractures.  IMPRESSION: 1. Stable cardiomegaly. Pulmonary  vascularity is normal. Cardiac pacer noted with lead tips in the right atrium and right ventricle. 2. Mild bibasilar subsegmental axis with small bilateral pleural effusions. 3. Stable lumbar compression fractures. Diffuse thoracolumbar spine osteopenia and degenerative change.   Electronically Signed   By: Maisie Fus  Register   On: 01/19/2014 15:01    Microbiology: Recent Results (from the past 240 hour(s))  MRSA PCR SCREENING     Status: None   Collection Time    01/19/14  6:36 PM      Result Value Ref Range Status   MRSA by PCR NEGATIVE  NEGATIVE Final   Comment:            The GeneXpert MRSA Assay (FDA     approved for NASAL specimens     only), is one component of a     comprehensive MRSA colonization     surveillance program. It is not     intended to diagnose MRSA     infection nor to guide or     monitor treatment for     MRSA infections.  URINE CULTURE     Status: None   Collection Time    01/21/14  5:58 PM      Result Value Ref Range Status   Specimen Description URINE, RANDOM   Final   Special Requests NONE   Final   Culture  Setup Time     Final   Value: 01/22/2014 00:16     Performed at [facility]   Colony Count     Final   Value: >=100,000 COLONIES/ML     Performed at [facility]   Culture     Final   Value: ESCHERICHIA COLI     Performed at [facility]   Report Status 01/23/2014 FINAL   Final   Organism ID, Bacteria ESCHERICHIA COLI   Final     Labs: Basic Metabolic Panel:  Recent Labs Lab 01/19/14 1323 01/20/14 0720 01/21/14 0235 01/22/14 0035 01/22/14 2350  NA 135* 136* 136* 136* 139  K 5.4* 5.6* 5.0 5.1 5.5*  CL 101 104 104 105 108  CO2 22 17* 17* 17* 17*  GLUCOSE 140* 111* 82 93 126*  BUN 56* 51* 43* 38* 36*  CREATININE 1.89* 1.87* 1.68* 1.62* 1.95*  CALCIUM 8.9 8.5 8.3* 8.5 8.5   Liver Function Tests:  Recent Labs Lab 01/20/14 0720 01/21/14 0235  AST 86* 45*  ALT 52* 36*  ALKPHOS 89 73  BILITOT 0.6 0.5  PROT 6.3 5.5*  ALBUMIN 3.0* 2.7*   No results found for this basename: LIPASE, AMYLASE,  in the last 168 hours No results found for this basename: AMMONIA,  in the last 168 hours CBC:  Recent Labs Lab 01/20/14 0545 01/20/14 1805 01/21/14 0235 01/22/14 0035 01/22/14 2350  WBC 8.3 8.4 7.3 6.6 6.5  HGB 11.4* 11.7* 10.7* 11.2* 11.4*  HCT 33.1* 34.9* 32.2* 34.5* 35.7*  MCV 88.3 86.8 87.0 88.2 92.5  PLT 227 184 184 175 174   Cardiac Enzymes:  Recent Labs Lab 01/19/14 2036 01/20/14 0545  TROPONINI <0.30 <0.30   BNP: BNP (last 3 results)  Recent Labs  01/10/14 1541 01/19/14 1323  PROBNP 449.0* 6160.0*   CBG: No results found for this basename: GLUCAP,  in the last 168 hours     Signed:  Carolyne Littles, Bishop [facility] [phone] pager

## 2014-01-23 NOTE — Progress Notes (Signed)
Agree with student assessment. Pt denies needs or pain at this time. No ss of distress noted. Will continue to monitor pt.

## 2014-01-23 NOTE — Progress Notes (Signed)
Subjective: No abdominal pain. Tolerating diet. No blood in stool.  Objective: Vital signs in last 24 hours: Temp:  [97.5 F (36.4 C)-98.1 F (36.7 C)] 97.5 F (36.4 C) (10/06 1016) Pulse Rate:  [59-62] 61 (10/06 1016) Resp:  [16-21] 16 (10/06 1016) BP: (109-148)/(56-71) 148/57 mmHg (10/06 1016) SpO2:  [96 %-100 %] 100 % (10/06 1016) Weight:  [60.9 kg (134 lb 4.2 oz)] 60.9 kg (134 lb 4.2 oz) (10/05 2146) Weight change:  Last BM Date: 01/23/14  PE: GEN:  NAD, much younger-appearing than stated age ABD:  Soft, non-tender.  Lab Results: CBC    Component Value Date/Time   WBC 6.5 01/22/2014 2350   WBC 4.5 11/09/2013   RBC 3.86* 01/22/2014 2350   RBC 2.26* 01/19/2014 2036   RBC 3.45* 07/28/2012 1605   HGB 11.4* 01/22/2014 2350   HCT 35.7* 01/22/2014 2350   PLT 174 01/22/2014 2350   MCV 92.5 01/22/2014 2350   MCH 29.5 01/22/2014 2350   MCH 31.3 07/28/2012 1605   MCHC 31.9 01/22/2014 2350   MCHC 32.1 07/28/2012 1605   RDW 16.4* 01/22/2014 2350   RDW 13.5 07/28/2012 1605   LYMPHSABS 0.9 01/10/2014 1541   LYMPHSABS 1.1 07/28/2012 1605   MONOABS 0.9 01/10/2014 1541   EOSABS 0.0 01/10/2014 1541   EOSABS 0.0 07/28/2012 1605   BASOSABS 0.0 01/10/2014 1541   BASOSABS 0.0 07/28/2012 1605   Assessment:  1. Melena. Likely from NSAID-related ulcers and esophagitis.  Resolved. 2. Anemia.  3. Mallory-Weiss tear. Iatrogenic (during procedure) seems most likely, since hematemesis wasn't part of patient's initial presentation. Quiescent at present.  Plan:  1.  Advance diet as tolerated. 2.  No NSAIDs. 3.  Pantoprazole 40 mg po qd indefinitely. 4 .  Will sign-off; please call with questions; thank you for the consult.  Happy to see patient in follow-up on as-needed basis.   Freddy JakschUTLAW,Abdulraheem Pineo M 01/23/2014, 10:23 AM

## 2014-01-25 ENCOUNTER — Other Ambulatory Visit: Payer: Self-pay | Admitting: *Deleted

## 2014-01-25 ENCOUNTER — Telehealth: Payer: Self-pay

## 2014-01-25 MED ORDER — HYDROCODONE-ACETAMINOPHEN 7.5-325 MG PO TABS: ORAL_TABLET | ORAL | Status: DC

## 2014-01-25 NOTE — Telephone Encounter (Signed)
[facility] let me know that the patient was in Health Care at this time. Called patient's home number to let her know that I was going to cancel her appt in the clinic on 01/30/14. We will make another appt after [facility], [facility] and [facility] Chilton SiGreen goes there to see the patients. Also called son Kennedy BuckerGrant and spoke with his wife Johnny BridgeMartha and told her.

## 2014-01-25 NOTE — Telephone Encounter (Signed)
[facility] 

## 2014-01-26 ENCOUNTER — Other Ambulatory Visit: Payer: Self-pay | Admitting: *Deleted

## 2014-01-26 ENCOUNTER — Telehealth: Payer: Self-pay | Admitting: *Deleted

## 2014-01-26 MED ORDER — LORAZEPAM 1 MG PO TABS: ORAL_TABLET | ORAL | Status: AC

## 2014-01-26 NOTE — Telephone Encounter (Signed)
Was in the Hospital and then released Skilled and you saw her last night. The Family has some questions and concerns and feels like she is deteriorating.   Please # [phone]

## 2014-01-26 NOTE — Telephone Encounter (Signed)
[facility] Fax Orders 

## 2014-01-26 NOTE — Telephone Encounter (Signed)
Daughter, Britta MccreedyBarbara, called and left voicemail and wants Dr. Chilton SiGreen to call her regarding patient.  Tried calling her back to see what it is in regard to and [facility].

## 2014-01-28 ENCOUNTER — Other Ambulatory Visit: Payer: Self-pay | Admitting: Internal Medicine

## 2014-01-29 LAB — HEPATIC FUNCTION PANEL
ALT: 36 U/L — AB (ref 7–35)
AST: 34 U/L (ref 13–35)
Alkaline Phosphatase: 87 U/L (ref 25–125)
Bilirubin, Total: 0.6 mg/dL

## 2014-01-29 LAB — BASIC METABOLIC PANEL
BUN: 23 mg/dL — AB (ref 4–21)
Creatinine: 1.3 mg/dL — AB (ref 0.5–1.1)
Glucose: 97 mg/dL
Potassium: 4.1 mmol/L (ref 3.4–5.3)
Sodium: 139 mmol/L (ref 137–147)

## 2014-01-29 LAB — CBC AND DIFFERENTIAL
HEMATOCRIT: 36 % (ref 36–46)
Hemoglobin: 11.5 g/dL — AB (ref 12.0–16.0)
PLATELETS: 160 10*3/uL (ref 150–399)
WBC: 10.7 10*3/mL

## 2014-01-30 ENCOUNTER — Encounter: Payer: Self-pay | Admitting: Internal Medicine

## 2014-01-31 NOTE — Telephone Encounter (Signed)
Call back made. Questions about how well she is doing. Patient was seen again 01/31/14 and issues were discussed with her son at bedside.

## 2014-02-01 ENCOUNTER — Encounter: Payer: Self-pay | Admitting: Internal Medicine

## 2014-02-02 ENCOUNTER — Non-Acute Institutional Stay (SKILLED_NURSING_FACILITY): Payer: Medicare Other | Admitting: Nurse Practitioner

## 2014-02-02 ENCOUNTER — Encounter: Payer: Self-pay | Admitting: Nurse Practitioner

## 2014-02-02 DIAGNOSIS — I442 Atrioventricular block, complete: Secondary | ICD-10-CM

## 2014-02-02 DIAGNOSIS — B370 Candidal stomatitis: Secondary | ICD-10-CM

## 2014-02-02 DIAGNOSIS — M48061 Spinal stenosis, lumbar region without neurogenic claudication: Secondary | ICD-10-CM

## 2014-02-02 DIAGNOSIS — M4806 Spinal stenosis, lumbar region: Secondary | ICD-10-CM

## 2014-02-02 DIAGNOSIS — N183 Chronic kidney disease, stage 3 unspecified: Secondary | ICD-10-CM

## 2014-02-02 DIAGNOSIS — E039 Hypothyroidism, unspecified: Secondary | ICD-10-CM

## 2014-02-02 DIAGNOSIS — R0602 Shortness of breath: Secondary | ICD-10-CM

## 2014-02-02 DIAGNOSIS — R21 Rash and other nonspecific skin eruption: Secondary | ICD-10-CM

## 2014-02-02 DIAGNOSIS — I5032 Chronic diastolic (congestive) heart failure: Secondary | ICD-10-CM

## 2014-02-02 DIAGNOSIS — I10 Essential (primary) hypertension: Secondary | ICD-10-CM

## 2014-02-02 DIAGNOSIS — I482 Chronic atrial fibrillation, unspecified: Secondary | ICD-10-CM

## 2014-02-02 DIAGNOSIS — R627 Adult failure to thrive: Secondary | ICD-10-CM

## 2014-02-02 DIAGNOSIS — K274 Chronic or unspecified peptic ulcer, site unspecified, with hemorrhage: Secondary | ICD-10-CM

## 2014-02-02 NOTE — Assessment & Plan Note (Addendum)
Continue O2 2lpm, Ativan q4h prn, Oxycodone 5mg  q4h prn.

## 2014-02-02 NOTE — Assessment & Plan Note (Signed)
Stabilized Hgb: 11.5 01/29/14

## 2014-02-02 NOTE — Progress Notes (Signed)
Patient ID: Sabrina Bishop, female   DOB: [birth year], 80 y.o.   MRN: [id number]   Code Status: DNR, MOST  Allergies  Allergen Reactions  . Vioxx [Rofecoxib] Other (See Comments)    Pt doesn't remember  . Adhesive [Tape] Rash    Chief Complaint  Patient presents with  . Medical Management of Chronic Issues  . Acute Visit    CHF, anemia, FTT    HPI: Patient is a 80 y.o. female seen in the SNF at [facility] today for evaluation of CHF, anemia, FTT,  and other chronic medical conditions.  Problem List Items Addressed This Visit   Spinal stenosis, lumbar region, without neurogenic claudication - Primary     Continue Norco 7/325mg  q4h and q4h prn. Dc Tramadol.     Shortness of breath (Chronic)     Continue O2 2lpm, Ativan q4h prn, Oxycodone 5mg  q4h prn.     Rash and nonspecific skin eruption     Pruritic upper chest patchy rash. Uncertain of onset and duration. Triamcinolone cream bid to affected area until healed.     Oral candidiasis     Vs inflammatory glossitis. Will have Magic mouth wash s/s ac and hs until healed.     Hypothyroidism     Takes Synthroid daily. 12/28/13 TSH 2.01-update TSH     GI bleed     Hgb stable 11.5 01/29/14. Dc Iron. Continue Protonix 40mg  daily.     FTT (failure to thrive) in adult     Multiple factorials. Palliative care. Dc Lipitor, Fe, Tramadol-plan of care is comfort measures.     Essential hypertension     Controlled.     DIASTOLIC HEART FAILURE, CHRONIC (Chronic)     Takes Furosemide 40mg  daily since 01/28/14, Bun/creat 23/1.28 01/29/14.     Chronic kidney disease, stage III (moderate)     Bun/creat 23/1.28 01/29/14. Continue to monitor BMP weekly x 4.     AV BLOCK, COMPLETE     Has pacemaker, takes Imdur 15mg  po daily. No angina since admitted to SNF    Atrial fibrillation (Chronic)     Heart rate is in control, takes Atenolol 25mg  daily, has pacemaker which is functional. Stopped Elliquis due to GI bleed.         Review of Systems:  Review of Systems  Constitutional: Positive for malaise/fatigue. Negative for fever and chills.  HENT:       C/o burning sensation of tongue   Respiratory: Positive for shortness of breath. Negative for cough, hemoptysis, sputum production and wheezing.   Cardiovascular: Positive for orthopnea and PND. Negative for chest pain, palpitations, claudication and leg swelling.  Gastrointestinal: Negative for abdominal pain.  Genitourinary: Negative for dysuria.  Musculoskeletal: Negative for falls.  Skin: Negative for itching and rash.       Patchy pruritic rash upper chest.   Neurological: Negative for dizziness and headaches.  Psychiatric/Behavioral: Negative for depression.     Past Medical History  Diagnosis Date  . Hypothyroidism   . OA (osteoarthritis)   . HTN (hypertension)   . Diastolic dysfunction   . AV BLOCK, COMPLETE 03/26/2009    Qualifier: Diagnosis of  By: Graciela Husbands, MD, Ruthann Cancer Ty Hilts   . Pacemaker-mdt DDD 12/16/2010  . Keratoconus, unspecified   . Disturbance of salivary secretion   . GERD (gastroesophageal reflux disease)   . Osteoarthrosis, unspecified whether generalized or localized, unspecified site   . Pain in joint, ankle and foot   . Spinal stenosis,  lumbar region, without neurogenic claudication   . Lumbago   . Trigger finger (acquired)   . Abnormality of gait   . Edema   . Dysphagia, unspecified(787.20)   . Seborrheic keratosis 05/2012  . Other atopic dermatitis and related conditions 03/2012  . Unspecified diastolic heart failure 02/16/2012  . Mixed hyperlipidemia 08/2011  . Chronic kidney disease, stage II (mild) 08/2011  . Hypoxemia 08/2011  . Enthesopathy of hip region 01/2011  . Keratoconjunctivitis 2011  . Atrial fibrillation 02/15/2013  . Unspecified venous (peripheral) insufficiency 2010  . Pain in joint, shoulder region 2015    left  . Keratoacanthoma of hand 2015    left  . Actinic keratoses 2015    right arm and  left cheek   Past Surgical History  Procedure Laterality Date  . Total knee arthroplasty  2005    left  . Abdominal hysterectomy  1950  . Pacemaker generator change  04/2012    Dr. Graciela Husbands  . Pacemaker placement  2004  . Total knee arthroplasty  1995    right  . Cataract extraction w/ intraocular lens implant  2012    right  . Esophagogastroduodenoscopy N/A 01/20/2014    Procedure: ESOPHAGOGASTRODUODENOSCOPY (EGD);  Surgeon: Shirley Friar, MD;  Location[facility];  Service: Endoscopy;  Laterality: N/A;   Social History:   reports that she has never smoked. She has never used smokeless tobacco. She reports that she does not drink alcohol or use illicit drugs.  Family History  Problem Relation Age of Onset  . Hyperlipidemia    . Hypertension    . Osteoporosis    . Stroke Father   . Congestive Heart Failure Brother   . Congestive Heart Failure Brother     Medications: Patient's Medications  New Prescriptions   No medications on file  Previous Medications   AMOXICILLIN (AMOXIL) 500 MG CAPSULE    Take 2,000 mg by mouth once as needed (prior to dental appointments).   ATENOLOL (TENORMIN) 50 MG TABLET    Take 0.5 tablets (25 mg total) by mouth daily.   GERIATRIC MULTIVITAMINS-MINERALS (ELDERTONIC/GEVRABON) ELIX    Take 15 mLs by mouth daily.   HYDROCODONE-ACETAMINOPHEN (NORCO) 7.5-325 MG PER TABLET    Take one tablet by mouth three times daily for pain   ISOSORBIDE MONONITRATE (IMDUR) 30 MG 24 HR TABLET    Take 0.5 tablets (15 mg total) by mouth daily.   LEVOTHYROXINE (SYNTHROID, LEVOTHROID) 125 MCG TABLET    Take 1 tablet (125 mcg total) by mouth daily before breakfast.   LORAZEPAM (ATIVAN) 1 MG TABLET    Take one tablet by mouth every 4 hours as needed for anxiety or agitation   OXYCODONE (ROXICODONE INTENSOL) 20 MG/ML CONCENTRATED SOLUTION    Take 5 mg by mouth every 4 (four) hours as needed for severe pain.   PANTOPRAZOLE (PROTONIX) 40 MG TABLET    Take 1 tablet (40 mg  total) by mouth daily.   VITAMIN C (VITAMIN C) 500 MG TABLET    Take 1 tablet (500 mg total) by mouth daily.  Modified Medications   No medications on file  Discontinued Medications   ATORVASTATIN (LIPITOR) 10 MG TABLET    Take 10 mg by mouth daily.   FERROUS FUMARATE (HEMOCYTE - 106 MG FE) 325 (106 FE) MG TABS TABLET    Take 1 tablet (106 mg of iron total) by mouth daily.   TRAMADOL (ULTRAM) 50 MG TABLET    Take 1 tablet (50 mg  total) by mouth every 12 (twelve) hours as needed for severe pain.     Physical Exam: Physical Exam  Constitutional: She is oriented to person, place, and time. No distress.  frail  HENT:  Head: Normocephalic and atraumatic.  Partial deafness. Inflamed tongue no lesions noted.   Eyes: Pupils are equal, round, and reactive to light.  Neck: Normal range of motion. Neck supple. No JVD present. No tracheal deviation present. No thyromegaly present.  Cardiovascular: Normal rate, regular rhythm, normal heart sounds and intact distal pulses.  Exam reveals no gallop and no friction rub.   No murmur heard. Pulmonary/Chest: Effort normal. No respiratory distress. She has no wheezes. She has rales. She exhibits no tenderness.  Abdominal: Bowel sounds are normal. She exhibits no distension and no mass. There is no tenderness.  Musculoskeletal: Normal range of motion. She exhibits edema. She exhibits no tenderness.  Using power chair and cane.  Lymphadenopathy:    She has no cervical adenopathy.  Neurological: She is alert and oriented to person, place, and time. No cranial nerve deficit.  Skin: Skin is warm and dry. No rash noted. No erythema. No pallor.  Senile ecchymoses. Patchy pruritic rash upper chest.    Psychiatric: She has a normal mood and affect. Her behavior is normal. Thought content normal.    Filed Vitals:   02/02/14 1428  BP: 137/85  Pulse: 60  Temp: 97.4 F (36.3 C)  TempSrc: Tympanic  Resp: 20      Labs reviewed: Basic Metabolic  Panel:  Recent Labs  11/09/13 12/28/13  01/19/14 1755  01/21/14 0235 01/22/14 0035 01/22/14 2350 01/29/14  NA 138 136*  < >  --   < > 136* 136* 139 139  K 4.1 4.2  < >  --   < > 5.0 5.1 5.5* 4.1  CL  --   --   < >  --   < > 104 105 108  --   CO2  --   --   < >  --   < > 17* 17* 17*  --   GLUCOSE  --   --   < >  --   < > 82 93 126*  --   BUN 43* 43*  < >  --   < > 43* 38* 36* 23*  CREATININE 1.7* 1.6*  < >  --   < > 1.68* 1.62* 1.95* 1.3*  CALCIUM  --   --   < >  --   < > 8.3* 8.5 8.5  --   TSH 1.60 2.01  --  10.510*  --   --   --   --   --   < > = values in this interval not displayed. Liver Function Tests:  Recent Labs  01/10/14 1541 01/20/14 0720 01/21/14 0235 01/29/14  AST 26 86* 45* 34  ALT 15 52* 36* 36*  ALKPHOS 54 89 73 87  BILITOT 0.8 0.6 0.5  --   PROT 6.7 6.3 5.5*  --   ALBUMIN 3.7 3.0* 2.7*  --    No results found for this basename: LIPASE, AMYLASE,  in the last 8760 hours No results found for this basename: AMMONIA,  in the last 8760 hours CBC:  Recent Labs  01/10/14 1541  01/21/14 0235 01/22/14 0035 01/22/14 2350 01/29/14  WBC 6.4  < > 7.3 6.6 6.5 10.7  NEUTROABS 4.6  --   --   --   --   --  HGB 10.0*  < > 10.7* 11.2* 11.4* 11.5*  HCT 29.9*  < > 32.2* 34.5* 35.7* 36  MCV 92.4  < > 87.0 88.2 92.5  --   PLT 185.0  < > 184 175 174 160  < > = values in this interval not displayed. Lipid Panel:  Recent Labs  05/29/13  CHOL 136  HDL 43  LDLCALC 74  TRIG 93    Past Procedures:     Assessment/Plan Hypothyroidism Takes Synthroid 125mcg daily. 12/28/13 TSH 2.01-update TSH   Atrial fibrillation Heart rate is in control, takes Atenolol 25mg  daily, has pacemaker which is functional. Stopped Elliquis due to GI bleed.   DIASTOLIC HEART FAILURE, CHRONIC Takes Furosemide 40mg  daily since 01/28/14, Bun/creat 23/1.28 01/29/14.   Chronic kidney disease, stage III (moderate) Bun/creat 23/1.28 01/29/14. Continue to monitor BMP weekly x 4.    Essential hypertension Controlled.   GI bleed Hgb stable 11.5 01/29/14. Dc Iron. Continue Protonix 40mg  daily.   Shortness of breath Continue O2 2lpm, Ativan q4h prn, Oxycodone 5mg  q4h prn.   AV BLOCK, COMPLETE Has pacemaker, takes Imdur 15mg  po daily. No angina since admitted to SNF  Spinal stenosis, lumbar region, without neurogenic claudication Continue Norco 7/325mg  q4h and q4h prn. Dc Tramadol.   FTT (failure to thrive) in adult Multiple factorials. Palliative care. Dc Lipitor, Fe, Tramadol-plan of care is comfort measures.   Acute blood loss anemia Stabilized Hgb: 11.5 01/29/14  Rash and nonspecific skin eruption Pruritic upper chest patchy rash. Uncertain of onset and duration. Triamcinolone cream bid to affected area until healed.   Oral candidiasis Vs inflammatory glossitis. Will have Magic mouth wash s/s ac and hs until healed.     Family/ Staff Communication: observe the patient. Palliative care.   Goals of Care: SNF  Labs/tests ordered: BMP weekly x 4

## 2014-02-02 NOTE — Assessment & Plan Note (Signed)
Bun/creat 23/1.28 01/29/14. Continue to monitor BMP weekly x 4.

## 2014-02-02 NOTE — Assessment & Plan Note (Addendum)
Controlled.  

## 2014-02-02 NOTE — Assessment & Plan Note (Signed)
Takes Furosemide 40mg  daily since 01/28/14, Bun/creat 23/1.28 01/29/14.

## 2014-02-02 NOTE — Assessment & Plan Note (Signed)
Continue Norco 7/325mg  q4h and q4h prn. Dc Tramadol.

## 2014-02-02 NOTE — Assessment & Plan Note (Signed)
Has pacemaker, takes Imdur 15mg  po daily. No angina since admitted to SNF

## 2014-02-02 NOTE — Assessment & Plan Note (Signed)
Hgb stable 11.5 01/29/14. Dc Iron. Continue Protonix 40mg  daily.

## 2014-02-02 NOTE — Assessment & Plan Note (Signed)
Takes Synthroid 125mcg daily. 12/28/13 TSH 2.01-update TSH

## 2014-02-02 NOTE — Assessment & Plan Note (Signed)
Heart rate is in control, takes Atenolol 25mg daily, has pacemaker which is functional. Stopped Elliquis due to GI bleed.     

## 2014-02-02 NOTE — Assessment & Plan Note (Signed)
Multiple factorials. Palliative care. Dc Lipitor, Fe, Tramadol-plan of care is comfort measures.

## 2014-02-05 DIAGNOSIS — R21 Rash and other nonspecific skin eruption: Secondary | ICD-10-CM | POA: Insufficient documentation

## 2014-02-05 DIAGNOSIS — B370 Candidal stomatitis: Secondary | ICD-10-CM | POA: Insufficient documentation

## 2014-02-05 LAB — BASIC METABOLIC PANEL
BUN: 25 mg/dL — AB (ref 4–21)
Creatinine: 1.4 mg/dL — AB (ref 0.5–1.1)
GLUCOSE: 97 mg/dL
POTASSIUM: 3.6 mmol/L (ref 3.4–5.3)
Sodium: 142 mmol/L (ref 137–147)

## 2014-02-05 LAB — TSH: TSH: 2.68 u[IU]/mL (ref 0.41–5.90)

## 2014-02-05 NOTE — Assessment & Plan Note (Signed)
Pruritic upper chest patchy rash. Uncertain of onset and duration. Triamcinolone cream bid to affected area until healed.

## 2014-02-05 NOTE — Assessment & Plan Note (Signed)
Vs inflammatory glossitis. Will have Magic mouth wash s/s ac and hs until healed.

## 2014-02-06 ENCOUNTER — Non-Acute Institutional Stay (SKILLED_NURSING_FACILITY): Payer: Medicare Other | Admitting: Nurse Practitioner

## 2014-02-06 ENCOUNTER — Encounter: Payer: Self-pay | Admitting: Nurse Practitioner

## 2014-02-06 DIAGNOSIS — D62 Acute posthemorrhagic anemia: Secondary | ICD-10-CM

## 2014-02-06 DIAGNOSIS — M48061 Spinal stenosis, lumbar region without neurogenic claudication: Secondary | ICD-10-CM

## 2014-02-06 DIAGNOSIS — I481 Persistent atrial fibrillation: Secondary | ICD-10-CM

## 2014-02-06 DIAGNOSIS — K5900 Constipation, unspecified: Secondary | ICD-10-CM | POA: Insufficient documentation

## 2014-02-06 DIAGNOSIS — M4806 Spinal stenosis, lumbar region: Secondary | ICD-10-CM

## 2014-02-06 DIAGNOSIS — R0602 Shortness of breath: Secondary | ICD-10-CM

## 2014-02-06 DIAGNOSIS — R609 Edema, unspecified: Secondary | ICD-10-CM

## 2014-02-06 DIAGNOSIS — R627 Adult failure to thrive: Secondary | ICD-10-CM

## 2014-02-06 DIAGNOSIS — N183 Chronic kidney disease, stage 3 unspecified: Secondary | ICD-10-CM

## 2014-02-06 DIAGNOSIS — E039 Hypothyroidism, unspecified: Secondary | ICD-10-CM

## 2014-02-06 DIAGNOSIS — I5032 Chronic diastolic (congestive) heart failure: Secondary | ICD-10-CM

## 2014-02-06 DIAGNOSIS — I4819 Other persistent atrial fibrillation: Secondary | ICD-10-CM

## 2014-02-06 DIAGNOSIS — I10 Essential (primary) hypertension: Secondary | ICD-10-CM

## 2014-02-06 DIAGNOSIS — K254 Chronic or unspecified gastric ulcer with hemorrhage: Secondary | ICD-10-CM

## 2014-02-06 NOTE — Assessment & Plan Note (Signed)
Multiple factorials. Palliative care. Dc Lipitor, Fe, Tramadol-plan of care is comfort measures.  

## 2014-02-06 NOTE — Assessment & Plan Note (Addendum)
Chronic, more pronounced in LLE, will measure mid calf daily, additional Furosemide 40mg  x1 today. Continue Furosemide. Hx of A-fib. Not a anticoagulation candidate due to GI bleed.

## 2014-02-06 NOTE — Assessment & Plan Note (Signed)
Controlled.  

## 2014-02-06 NOTE — Assessment & Plan Note (Signed)
Hgb stable 11.5 01/29/14. Off Iron. Continue Protonix 40mg  daily.

## 2014-02-06 NOTE — Assessment & Plan Note (Signed)
Continue Norco 7/325mg q4h and q4h prn. Off Tramadol.    

## 2014-02-06 NOTE — Assessment & Plan Note (Signed)
Stabilized Hgb: 11.5 01/29/14. Off Iron. No active bleed.

## 2014-02-06 NOTE — Assessment & Plan Note (Signed)
Takes Synthroid 125mcg daily.  12/28/13 TSH 2.01 02/05/14 TSH 2.68

## 2014-02-06 NOTE — Assessment & Plan Note (Signed)
Will try MiraLax daily. Monitor the patient.

## 2014-02-06 NOTE — Assessment & Plan Note (Addendum)
Takes Furosemide 40mg  daily since 01/28/14, Bun/creat 23/1.28 01/29/14. 02/05/14 BNP 460.3-Furosemide 40mg  x1 10/201/5 due to increased edema and mild expiratory wheezes posterior R lung.

## 2014-02-06 NOTE — Assessment & Plan Note (Signed)
Heart rate is in control, takes Atenolol 25mg daily, has pacemaker which is functional. Stopped Elliquis due to GI bleed.     

## 2014-02-06 NOTE — Assessment & Plan Note (Signed)
Continue O2 2lpm, Ativan q4h prn, Oxycodone 5mg q4h prn.  

## 2014-02-06 NOTE — Assessment & Plan Note (Signed)
02/05/14 Bun/creat 25/1.38, continue Furosemide.   

## 2014-02-06 NOTE — Progress Notes (Signed)
Patient ID: Sabrina Bishop, female   DOB: 1916-12-17, 80 y.o.   MRN: [id number]   Code Status: DNR, MOST  Allergies  Allergen Reactions  . Vioxx [Rofecoxib] Other (See Comments)    Pt doesn't remember  . Adhesive [Tape] Rash    Chief Complaint  Patient presents with  . Medical Management of Chronic Issues  . Acute Visit    constipation    HPI: Patient is a 80 y.o. female seen in the SNF at [facility] today for evaluation of constipation, CHF, anemia, FTT,  and other chronic medical conditions.  Problem List Items Addressed This Visit   Spinal stenosis, lumbar region, without neurogenic claudication     Continue Norco 7/325mg  q4h and q4h prn. Off Tramadol.      Shortness of breath (Chronic)     Continue O2 2lpm, Ativan q4h prn, Oxycodone 5mg  q4h prn.      Hypothyroidism     Takes Synthroid daily.  12/28/13 TSH 2.01 02/05/14 TSH 2.68     GI bleed     Hgb stable 11.5 01/29/14. Off Iron. Continue Protonix 40mg  daily.      FTT (failure to thrive) in adult     Multiple factorials. Palliative care. Dc Lipitor, Fe, Tramadol-plan of care is comfort measures.      Essential hypertension     Controlled.     Edema (Chronic)     Chronic, more pronounced in LLE, will measure mid calf daily, additional Furosemide 40mg  x1 today. Continue Furosemide. Hx of A-fib. Not a anticoagulation candidate due to GI bleed.     DIASTOLIC HEART FAILURE, CHRONIC (Chronic)     Takes Furosemide 40mg  daily since 01/28/14, Bun/creat 23/1.28 01/29/14. 02/05/14 BNP 460.3-Furosemide 40mg  x1 10/201/5 due to increased edema and mild expiratory wheezes posterior R lung.      Constipation - Primary     Will try MiraLax daily. Monitor the patient.     Chronic kidney disease, stage III (moderate)     02/05/14 Bun/creat 25/1.38, continue Furosemide.     Atrial fibrillation (Chronic)     Heart rate is in control, takes Atenolol 25mg  daily, has pacemaker which is functional. Stopped  Elliquis due to GI bleed.      Acute blood loss anemia     Stabilized Hgb: 11.5 01/29/14. Off Iron. No active bleed.         Review of Systems:  Review of Systems  Constitutional: Positive for malaise/fatigue. Negative for fever and chills.  HENT:       C/o burning sensation of tongue   Respiratory: Positive for shortness of breath. Negative for cough, hemoptysis, sputum production and wheezing.   Cardiovascular: Positive for orthopnea, leg swelling and PND. Negative for chest pain, palpitations and claudication.       Worsened edema BLE  Gastrointestinal: Positive for constipation. Negative for abdominal pain.  Genitourinary: Negative for dysuria.  Musculoskeletal: Negative for falls.  Skin: Negative for itching and rash.       Patchy pruritic rash upper chest.   Neurological: Negative for dizziness and headaches.  Psychiatric/Behavioral: Negative for depression.     Past Medical History  Diagnosis Date  . Hypothyroidism   . OA (osteoarthritis)   . HTN (hypertension)   . Diastolic dysfunction   . AV BLOCK, COMPLETE 03/26/2009    Qualifier: Diagnosis of  By: Graciela Husbands, MD, Ruthann Cancer Ty Hilts   . Pacemaker-mdt DDD 12/16/2010  . Keratoconus, unspecified   . Disturbance of salivary secretion   .  GERD (gastroesophageal reflux disease)   . Osteoarthrosis, unspecified whether generalized or localized, unspecified site   . Pain in joint, ankle and foot   . Spinal stenosis, lumbar region, without neurogenic claudication   . Lumbago   . Trigger finger (acquired)   . Abnormality of gait   . Edema   . Dysphagia, unspecified(787.20)   . Seborrheic keratosis 05/2012  . Other atopic dermatitis and related conditions 03/2012  . Unspecified diastolic heart failure 02/16/2012  . Mixed hyperlipidemia 08/2011  . Chronic kidney disease, stage II (mild) 08/2011  . Hypoxemia 08/2011  . Enthesopathy of hip region 01/2011  . Keratoconjunctivitis 2011  . Atrial fibrillation 02/15/2013  .  Unspecified venous (peripheral) insufficiency 2010  . Pain in joint, shoulder region 2015    left  . Keratoacanthoma of hand 2015    left  . Actinic keratoses 2015    right arm and left cheek   Past Surgical History  Procedure Laterality Date  . Total knee arthroplasty  2005    left  . Abdominal hysterectomy  1950  . Pacemaker generator change  04/2012    Dr. Graciela HusbandsKlein  . Pacemaker placement  2004  . Total knee arthroplasty  1995    right  . Cataract extraction w/ intraocular lens implant  2012    right  . Esophagogastroduodenoscopy N/A 01/20/2014    Procedure: ESOPHAGOGASTRODUODENOSCOPY (EGD);  Surgeon: Shirley FriarVincent C. Schooler, MD;  [facility];  Service: Endoscopy;  Laterality: N/A;   Social History:   reports that she has never smoked. She has never used smokeless tobacco. She reports that she does not drink alcohol or use illicit drugs.  Family History  Problem Relation Age of Onset  . Hyperlipidemia    . Hypertension    . Osteoporosis    . Stroke Father   . Congestive Heart Failure Brother   . Congestive Heart Failure Brother     Medications: Patient's Medications  New Prescriptions   No medications on file  Previous Medications   AMOXICILLIN (AMOXIL) 500 MG CAPSULE    Take 2,000 mg by mouth once as needed (prior to dental appointments).   ATENOLOL (TENORMIN) 50 MG TABLET    Take 0.5 tablets (25 mg total) by mouth daily.   GERIATRIC MULTIVITAMINS-MINERALS (ELDERTONIC/GEVRABON) ELIX    Take 15 mLs by mouth daily.   HYDROCODONE-ACETAMINOPHEN (NORCO) 7.5-325 MG PER TABLET    Take one tablet by mouth three times daily for pain   ISOSORBIDE MONONITRATE (IMDUR) 30 MG 24 HR TABLET    Take 0.5 tablets (15 mg total) by mouth daily.   LEVOTHYROXINE (SYNTHROID, LEVOTHROID) 125 MCG TABLET    Take 1 tablet (125 mcg total) by mouth daily before breakfast.   LORAZEPAM (ATIVAN) 1 MG TABLET    Take one tablet by mouth every 4 hours as needed for anxiety or agitation   OXYCODONE  (ROXICODONE INTENSOL) 20 MG/ML CONCENTRATED SOLUTION    Take 5 mg by mouth every 4 (four) hours as needed for severe pain.   PANTOPRAZOLE (PROTONIX) 40 MG TABLET    Take 1 tablet (40 mg total) by mouth daily.   VITAMIN C (VITAMIN C) 500 MG TABLET    Take 1 tablet (500 mg total) by mouth daily.  Modified Medications   No medications on file  Discontinued Medications   No medications on file     Physical Exam: Physical Exam  Constitutional: She is oriented to person, place, and time. No distress.  frail  HENT:  Head: Normocephalic and atraumatic.  Partial deafness. Inflamed tongue no lesions noted.   Eyes: Pupils are equal, round, and reactive to light.  Neck: Normal range of motion. Neck supple. No JVD present. No tracheal deviation present. No thyromegaly present.  Cardiovascular: Normal rate, regular rhythm, normal heart sounds and intact distal pulses.  Exam reveals no gallop and no friction rub.   No murmur heard. Pulmonary/Chest: Effort normal. No respiratory distress. She has no wheezes. She has rales. She exhibits no tenderness.  Abdominal: Bowel sounds are normal. She exhibits no distension and no mass. There is no tenderness.  Musculoskeletal: Normal range of motion. She exhibits edema. She exhibits no tenderness.  Using power chair and cane. BLE edema 2+ LLE>RLE  Lymphadenopathy:    She has no cervical adenopathy.  Neurological: She is alert and oriented to person, place, and time. No cranial nerve deficit.  Skin: Skin is warm and dry. No rash noted. No erythema. No pallor.  Senile ecchymoses. Patchy pruritic rash upper chest.    Psychiatric: She has a normal mood and affect. Her behavior is normal. Thought content normal.    Filed Vitals:   02/06/14 1123  BP: 144/72  Pulse: 63  Temp: 97.3 F (36.3 C)  TempSrc: Tympanic  Resp: 18      Labs reviewed: Basic Metabolic Panel:  Recent Labs  16/01/9608/10/15  01/19/14 1755  01/21/14 0235 01/22/14 0035 01/22/14 2350  01/29/14 02/05/14  NA 136*  < >  --   < > 136* 136* 139 139 142  K 4.2  < >  --   < > 5.0 5.1 5.5* 4.1 3.6  CL  --   < >  --   < > 104 105 108  --   --   CO2  --   < >  --   < > 17* 17* 17*  --   --   GLUCOSE  --   < >  --   < > 82 93 126*  --   --   BUN 43*  < >  --   < > 43* 38* 36* 23* 25*  CREATININE 1.6*  < >  --   < > 1.68* 1.62* 1.95* 1.3* 1.4*  CALCIUM  --   < >  --   < > 8.3* 8.5 8.5  --   --   TSH 2.01  --  10.510*  --   --   --   --   --  2.68  < > = values in this interval not displayed. Liver Function Tests:  Recent Labs  01/10/14 1541 01/20/14 0720 01/21/14 0235 01/29/14  AST 26 86* 45* 34  ALT 15 52* 36* 36*  ALKPHOS 54 89 73 87  BILITOT 0.8 0.6 0.5  --   PROT 6.7 6.3 5.5*  --   ALBUMIN 3.7 3.0* 2.7*  --    No results found for this basename: LIPASE, AMYLASE,  in the last 8760 hours No results found for this basename: AMMONIA,  in the last 8760 hours CBC:  Recent Labs  01/10/14 1541  01/21/14 0235 01/22/14 0035 01/22/14 2350 01/29/14  WBC 6.4  < > 7.3 6.6 6.5 10.7  NEUTROABS 4.6  --   --   --   --   --   HGB 10.0*  < > 10.7* 11.2* 11.4* 11.5*  HCT 29.9*  < > 32.2* 34.5* 35.7* 36  MCV 92.4  < > 87.0 88.2 92.5  --  PLT 185.0  < > 184 175 174 160  < > = values in this interval not displayed. Lipid Panel:  Recent Labs  05/29/13  CHOL 136  HDL 43  LDLCALC 74  TRIG 93    Past Procedures:  01/19/14 CXR   IMPRESSION:  1. Stable cardiomegaly. Pulmonary vascularity is normal. Cardiac  pacer noted with lead tips in the right atrium and right ventricle.  2. Mild bibasilar subsegmental axis with small bilateral pleural  effusions.  3. Stable lumbar compression fractures. Diffuse thoracolumbar spine  osteopenia and degenerative change.    Assessment/Plan Constipation Will try MiraLax daily. Monitor the patient.   Hypothyroidism Takes Synthroid daily.  12/28/13 TSH 2.01 02/05/14 TSH 2.68   Chronic kidney disease, stage III  (moderate) 02/05/14 Bun/creat 25/1.38, continue Furosemide.   DIASTOLIC HEART FAILURE, CHRONIC Takes Furosemide 40mg  daily since 01/28/14, Bun/creat 23/1.28 01/29/14. 02/05/14 BNP 460.3-Furosemide 40mg  x1 10/201/5 due to increased edema and mild expiratory wheezes posterior R lung.    Spinal stenosis, lumbar region, without neurogenic claudication Continue Norco 7/325mg  q4h and q4h prn. Off Tramadol.    Shortness of breath Continue O2 2lpm, Ativan q4h prn, Oxycodone 5mg  q4h prn.    GI bleed Hgb stable 11.5 01/29/14. Off Iron. Continue Protonix 40mg  daily.    FTT (failure to thrive) in adult Multiple factorials. Palliative care. Dc Lipitor, Fe, Tramadol-plan of care is comfort measures.    Essential hypertension Controlled.   Atrial fibrillation Heart rate is in control, takes Atenolol 25mg  daily, has pacemaker which is functional. Stopped Elliquis due to GI bleed.    Acute blood loss anemia Stabilized Hgb: 11.5 01/29/14. Off Iron. No active bleed.    Edema Chronic, more pronounced in LLE, will measure mid calf daily, additional Furosemide 40mg  x1 today. Continue Furosemide. Hx of A-fib. Not a anticoagulation candidate due to GI bleed.     Family/ Staff Communication: observe the patient. Palliative care.   Goals of Care: SNF  Labs/tests ordered: BMP weekly x 4

## 2014-02-08 ENCOUNTER — Encounter: Payer: Medicare Other | Admitting: Physician Assistant

## 2014-02-12 LAB — BASIC METABOLIC PANEL
BUN: 30 mg/dL — AB (ref 4–21)
Creatinine: 1.5 mg/dL — AB (ref 0.5–1.1)
Glucose: 108 mg/dL
Potassium: 3.7 mmol/L (ref 3.4–5.3)
SODIUM: 141 mmol/L (ref 137–147)

## 2014-02-13 ENCOUNTER — Encounter: Payer: Self-pay | Admitting: Nurse Practitioner

## 2014-02-13 ENCOUNTER — Other Ambulatory Visit: Payer: Self-pay | Admitting: Nurse Practitioner

## 2014-02-13 ENCOUNTER — Non-Acute Institutional Stay (SKILLED_NURSING_FACILITY): Payer: Medicare Other | Admitting: Nurse Practitioner

## 2014-02-13 DIAGNOSIS — R21 Rash and other nonspecific skin eruption: Secondary | ICD-10-CM

## 2014-02-13 DIAGNOSIS — N183 Chronic kidney disease, stage 3 unspecified: Secondary | ICD-10-CM

## 2014-02-13 DIAGNOSIS — K5900 Constipation, unspecified: Secondary | ICD-10-CM

## 2014-02-13 DIAGNOSIS — R0602 Shortness of breath: Secondary | ICD-10-CM

## 2014-02-13 DIAGNOSIS — D62 Acute posthemorrhagic anemia: Secondary | ICD-10-CM

## 2014-02-13 DIAGNOSIS — I482 Chronic atrial fibrillation, unspecified: Secondary | ICD-10-CM

## 2014-02-13 DIAGNOSIS — M48061 Spinal stenosis, lumbar region without neurogenic claudication: Secondary | ICD-10-CM

## 2014-02-13 DIAGNOSIS — I10 Essential (primary) hypertension: Secondary | ICD-10-CM

## 2014-02-13 DIAGNOSIS — R609 Edema, unspecified: Secondary | ICD-10-CM

## 2014-02-13 DIAGNOSIS — I5032 Chronic diastolic (congestive) heart failure: Secondary | ICD-10-CM

## 2014-02-13 DIAGNOSIS — E039 Hypothyroidism, unspecified: Secondary | ICD-10-CM

## 2014-02-13 DIAGNOSIS — M4806 Spinal stenosis, lumbar region: Secondary | ICD-10-CM

## 2014-02-13 DIAGNOSIS — B370 Candidal stomatitis: Secondary | ICD-10-CM

## 2014-02-13 NOTE — Assessment & Plan Note (Signed)
Stabilized Hgb: 11.5 01/29/14. Off Iron. No active bleed.   

## 2014-02-13 NOTE — Assessment & Plan Note (Signed)
akes Synthroid 125mcg daily.  12/28/13 TSH 2.01 02/05/14 TSH 2.68

## 2014-02-13 NOTE — Assessment & Plan Note (Signed)
MiraLax daily, controlled.

## 2014-02-13 NOTE — Assessment & Plan Note (Addendum)
Continue O2 2lpm, Ativan q4h prn, Oxycodone 5mg  q4h prn. Weight 2x/week-may Furosemide 40mg  2x/week if wt gain 2-3Ibs. Moist cough and worsened edema BLE likely related to CHF. May CXR to r/o acute pulmonary process.

## 2014-02-13 NOTE — Progress Notes (Signed)
Patient ID: Sabrina Bishop, female   DOB: 1916/06/05, 80 y.o.   MRN: [id number]   Code Status: DNR, MOST  Allergies  Allergen Reactions  . Vioxx [Rofecoxib] Other (See Comments)    Pt doesn't remember  . Adhesive [Tape] Rash    Chief Complaint  Patient presents with  . Medical Management of Chronic Issues  . Acute Visit    moist cough and worsened swelling BLE    HPI: Patient is a 80 y.o. female seen in the SNF at [facility] today for evaluation of moist cough, worsened edema BLE,  and other chronic medical conditions.  Problem List Items Addressed This Visit   Spinal stenosis, lumbar region, without neurogenic claudication     Continue Norco 7/325mg  q4h and q4h prn. Off Tramadol.       Shortness of breath - Primary (Chronic)     Continue O2 2lpm, Ativan q4h prn, Oxycodone 5mg  q4h prn. Weight 2x/week-may Furosemide 40mg  2x/week if wt gain 2-3Ibs. Moist cough and worsened edema BLE likely related to CHF. May CXR to r/o acute pulmonary process.       Rash and nonspecific skin eruption     Improved.     Oral candidiasis     improved    Hypothyroidism     akes Synthroid daily.  12/28/13 TSH 2.01 02/05/14 TSH 2.68      Essential hypertension     Controlled.      Edema (Chronic)     Reportedly worse BLE, will weight 2x/wk and have Furosemide 40mg  2x/wk prn for weight gain 2-3 Ibs.     DIASTOLIC HEART FAILURE, CHRONIC (Chronic)     Takes Furosemide 40mg  daily since 01/28/14, Bun/creat 23/1.28 01/29/14. 02/05/14 BNP 460.3-Furosemide 40mg  x1 10/201/5 due to increased edema and mild expiratory wheezes posterior R lung.  02/13/13 moist cough and worsened BLE edema: wt 2x/wk, prn Furosemide 40mg  x2/wk for wt gain 2-3 Ibs. CXR to r/o acute pulmonary process. Wheezes-DuoNeb q6h x 3 days then prn. Mucinex 600mg  bid. Furosemide 40mg  stat.      Constipation     MiraLax daily, controlled.     Chronic kidney disease, stage III (moderate)     02/05/14  Bun/creat 25/1.38, continue Furosemide.      Atrial fibrillation (Chronic)     Heart rate is in control, takes Atenolol 25mg  daily, has pacemaker which is functional. Stopped Elliquis due to GI bleed.       Acute blood loss anemia     Stabilized Hgb: 11.5 01/29/14. Off Iron. No active bleed.          Review of Systems:  Review of Systems  Constitutional: Positive for malaise/fatigue. Negative for fever and chills.  HENT:       C/o burning sensation of tongue-better  Respiratory: Positive for cough, shortness of breath and wheezing. Negative for hemoptysis and sputum production.        Moist cough  Cardiovascular: Positive for orthopnea, leg swelling and PND. Negative for chest pain, palpitations and claudication.       Worsened edema BLE  Gastrointestinal: Positive for constipation. Negative for abdominal pain.  Genitourinary: Negative for dysuria.  Musculoskeletal: Negative for falls.  Skin: Negative for itching and rash.       Patchy pruritic rash upper chest-better  Neurological: Negative for dizziness and headaches.  Psychiatric/Behavioral: Negative for depression.     Past Medical History  Diagnosis Date  . Hypothyroidism   . OA (osteoarthritis)   . HTN (hypertension)   .  Diastolic dysfunction   . AV BLOCK, COMPLETE 03/26/2009    Qualifier: Diagnosis of  By: Graciela HusbandsKlein, MD, Ruthann CancerFACC, Ty HiltsSteven Cochran   . Pacemaker-mdt DDD 12/16/2010  . Keratoconus, unspecified   . Disturbance of salivary secretion   . GERD (gastroesophageal reflux disease)   . Osteoarthrosis, unspecified whether generalized or localized, unspecified site   . Pain in joint, ankle and foot   . Spinal stenosis, lumbar region, without neurogenic claudication   . Lumbago   . Trigger finger (acquired)   . Abnormality of gait   . Edema   . Dysphagia, unspecified(787.20)   . Seborrheic keratosis 05/2012  . Other atopic dermatitis and related conditions 03/2012  . Unspecified diastolic heart failure 02/16/2012    . Mixed hyperlipidemia 08/2011  . Chronic kidney disease, stage II (mild) 08/2011  . Hypoxemia 08/2011  . Enthesopathy of hip region 01/2011  . Keratoconjunctivitis 2011  . Atrial fibrillation 02/15/2013  . Unspecified venous (peripheral) insufficiency 2010  . Pain in joint, shoulder region 2015    left  . Keratoacanthoma of hand 2015    left  . Actinic keratoses 2015    right arm and left cheek   Past Surgical History  Procedure Laterality Date  . Total knee arthroplasty  2005    left  . Abdominal hysterectomy  1950  . Pacemaker generator change  04/2012    Dr. Graciela HusbandsKlein  . Pacemaker placement  2004  . Total knee arthroplasty  1995    right  . Cataract extraction w/ intraocular lens implant  2012    right  . Esophagogastroduodenoscopy N/A 01/20/2014    Procedure: ESOPHAGOGASTRODUODENOSCOPY (EGD);  Surgeon: Shirley FriarVincent C. Schooler, MD;  [facility];  Service: Endoscopy;  Laterality: N/A;   Social History:   reports that she has never smoked. She has never used smokeless tobacco. She reports that she does not drink alcohol or use illicit drugs.  Family History  Problem Relation Age of Onset  . Hyperlipidemia    . Hypertension    . Osteoporosis    . Stroke Father   . Congestive Heart Failure Brother   . Congestive Heart Failure Brother     Medications: Patient's Medications  New Prescriptions   No medications on file  Previous Medications   AMOXICILLIN (AMOXIL) 500 MG CAPSULE    Take 2,000 mg by mouth once as needed (prior to dental appointments).   ATENOLOL (TENORMIN) 50 MG TABLET    Take 0.5 tablets (25 mg total) by mouth daily.   GERIATRIC MULTIVITAMINS-MINERALS (ELDERTONIC/GEVRABON) ELIX    Take 15 mLs by mouth daily.   HYDROCODONE-ACETAMINOPHEN (NORCO) 7.5-325 MG PER TABLET    Take one tablet by mouth three times daily for pain   ISOSORBIDE MONONITRATE (IMDUR) 30 MG 24 HR TABLET    Take 0.5 tablets (15 mg total) by mouth daily.   LEVOTHYROXINE (SYNTHROID,  LEVOTHROID) 125 MCG TABLET    Take 1 tablet (125 mcg total) by mouth daily before breakfast.   LORAZEPAM (ATIVAN) 1 MG TABLET    Take one tablet by mouth every 4 hours as needed for anxiety or agitation   OXYCODONE (ROXICODONE INTENSOL) 20 MG/ML CONCENTRATED SOLUTION    Take 5 mg by mouth every 4 (four) hours as needed for severe pain.   PANTOPRAZOLE (PROTONIX) 40 MG TABLET    Take 1 tablet (40 mg total) by mouth daily.   VITAMIN C (VITAMIN C) 500 MG TABLET    Take 1 tablet (500 mg total) by mouth daily.  Modified Medications   No medications on file  Discontinued Medications   No medications on file     Physical Exam: Physical Exam  Constitutional: She is oriented to person, place, and time. No distress.  frail  HENT:  Head: Normocephalic and atraumatic.  Partial deafness. Inflamed tongue no lesions noted.   Eyes: Pupils are equal, round, and reactive to light.  Neck: Normal range of motion. Neck supple. No JVD present. No tracheal deviation present. No thyromegaly present.  Cardiovascular: Normal rate, regular rhythm, normal heart sounds and intact distal pulses.  Exam reveals no gallop and no friction rub.   No murmur heard. Pulmonary/Chest: Effort normal. No respiratory distress. She has wheezes. She has rales. She exhibits no tenderness.  Moist rales diffused.   Abdominal: Bowel sounds are normal. She exhibits no distension and no mass. There is no tenderness.  Musculoskeletal: Normal range of motion. She exhibits edema. She exhibits no tenderness.  Using power chair and cane. BLE edema 2+ LLE>RLE  Lymphadenopathy:    She has no cervical adenopathy.  Neurological: She is alert and oriented to person, place, and time. No cranial nerve deficit.  Skin: Skin is warm and dry. No rash noted. No erythema. No pallor.  Senile ecchymoses. Patchy pruritic rash upper chest.    Psychiatric: She has a normal mood and affect. Her behavior is normal. Thought content normal.    Filed Vitals:    02/13/14 1032  BP: 143/69  Pulse: 60  Temp: 98 F (36.7 C)  TempSrc: Tympanic  Resp: 20      Labs reviewed: Basic Metabolic Panel:  Recent Labs  16/10/96  01/19/14 1755  01/21/14 0235 01/22/14 0035 01/22/14 2350 01/29/14 02/05/14  NA 136*  < >  --   < > 136* 136* 139 139 142  K 4.2  < >  --   < > 5.0 5.1 5.5* 4.1 3.6  CL  --   < >  --   < > 104 105 108  --   --   CO2  --   < >  --   < > 17* 17* 17*  --   --   GLUCOSE  --   < >  --   < > 82 93 126*  --   --   BUN 43*  < >  --   < > 43* 38* 36* 23* 25*  CREATININE 1.6*  < >  --   < > 1.68* 1.62* 1.95* 1.3* 1.4*  CALCIUM  --   < >  --   < > 8.3* 8.5 8.5  --   --   TSH 2.01  --  10.510*  --   --   --   --   --  2.68  < > = values in this interval not displayed. Liver Function Tests:  Recent Labs  01/10/14 1541 01/20/14 0720 01/21/14 0235 01/29/14  AST 26 86* 45* 34  ALT 15 52* 36* 36*  ALKPHOS 54 89 73 87  BILITOT 0.8 0.6 0.5  --   PROT 6.7 6.3 5.5*  --   ALBUMIN 3.7 3.0* 2.7*  --    No results found for this basename: LIPASE, AMYLASE,  in the last 8760 hours No results found for this basename: AMMONIA,  in the last 8760 hours CBC:  Recent Labs  01/10/14 1541  01/21/14 0235 01/22/14 0035 01/22/14 2350 01/29/14  WBC 6.4  < > 7.3 6.6 6.5 10.7  NEUTROABS 4.6  --   --   --   --   --  HGB 10.0*  < > 10.7* 11.2* 11.4* 11.5*  HCT 29.9*  < > 32.2* 34.5* 35.7* 36  MCV 92.4  < > 87.0 88.2 92.5  --   PLT 185.0  < > 184 175 174 160  < > = values in this interval not displayed. Lipid Panel:  Recent Labs  05/29/13  CHOL 136  HDL 43  LDLCALC 74  TRIG 93    Past Procedures:  01/19/14 CXR   IMPRESSION:  1. Stable cardiomegaly. Pulmonary vascularity is normal. Cardiac  pacer noted with lead tips in the right atrium and right ventricle.  2. Mild bibasilar subsegmental axis with small bilateral pleural  effusions.  3. Stable lumbar compression fractures. Diffuse thoracolumbar spine  osteopenia and  degenerative change.    Assessment/Plan Spinal stenosis, lumbar region, without neurogenic claudication Continue Norco 7/325mg  q4h and q4h prn. Off Tramadol.     Shortness of breath Continue O2 2lpm, Ativan q4h prn, Oxycodone 5mg  q4h prn. Weight 2x/week-may Furosemide 40mg  2x/week if wt gain 2-3Ibs. Moist cough and worsened edema BLE likely related to CHF. May CXR to r/o acute pulmonary process.     Acute blood loss anemia Stabilized Hgb: 11.5 01/29/14. Off Iron. No active bleed.     Atrial fibrillation Heart rate is in control, takes Atenolol 25mg  daily, has pacemaker which is functional. Stopped Elliquis due to GI bleed.     Chronic kidney disease, stage III (moderate) 02/05/14 Bun/creat 25/1.38, continue Furosemide.    Constipation MiraLax daily, controlled.   DIASTOLIC HEART FAILURE, CHRONIC Takes Furosemide 40mg  daily since 01/28/14, Bun/creat 23/1.28 01/29/14. 02/05/14 BNP 460.3-Furosemide 40mg  x1 10/201/5 due to increased edema and mild expiratory wheezes posterior R lung.  02/13/13 moist cough and worsened BLE edema: wt 2x/wk, prn Furosemide 40mg  x2/wk for wt gain 2-3 Ibs. CXR to r/o acute pulmonary process. Wheezes-DuoNeb q6h x 3 days then prn. Mucinex 600mg  bid. Furosemide 40mg  stat.    Edema Reportedly worse BLE, will weight 2x/wk and have Furosemide 40mg  2x/wk prn for weight gain 2-3 Ibs.   Essential hypertension Controlled.    Hypothyroidism akes Synthroid 125mcg daily.  12/28/13 TSH 2.01 02/05/14 TSH 2.68    Oral candidiasis improved  Rash and nonspecific skin eruption Improved.     Family/ Staff Communication: observe the patient. Palliative care.   Goals of Care: SNF  Labs/tests ordered: BMP weekly x 4. CXR. Weight x2/week.

## 2014-02-13 NOTE — Assessment & Plan Note (Signed)
Continue Norco 7/325mg q4h and q4h prn. Off Tramadol.    

## 2014-02-13 NOTE — Assessment & Plan Note (Signed)
Heart rate is in control, takes Atenolol 25mg daily, has pacemaker which is functional. Stopped Elliquis due to GI bleed.     

## 2014-02-13 NOTE — Assessment & Plan Note (Signed)
improved

## 2014-02-13 NOTE — Assessment & Plan Note (Addendum)
Takes Furosemide 40mg  daily since 01/28/14, Bun/creat 23/1.28 01/29/14. 02/05/14 BNP 460.3-Furosemide 40mg  x1 10/201/5 due to increased edema and mild expiratory wheezes posterior R lung.  02/13/13 moist cough and worsened BLE edema: wt 2x/wk, prn Furosemide 40mg  x2/wk for wt gain 2-3 Ibs. CXR to r/o acute pulmonary process. Wheezes-DuoNeb q6h x 3 days then prn. Mucinex 600mg  bid. Furosemide 40mg  stat.

## 2014-02-13 NOTE — Assessment & Plan Note (Signed)
Improved

## 2014-02-13 NOTE — Assessment & Plan Note (Signed)
Reportedly worse BLE, will weight 2x/wk and have Furosemide 40mg  2x/wk prn for weight gain 2-3 Ibs.

## 2014-02-13 NOTE — Assessment & Plan Note (Signed)
Controlled.  

## 2014-02-13 NOTE — Assessment & Plan Note (Signed)
02/05/14 Bun/creat 25/1.38, continue Furosemide.

## 2014-02-16 ENCOUNTER — Encounter: Payer: Self-pay | Admitting: Internal Medicine

## 2014-02-16 ENCOUNTER — Other Ambulatory Visit: Payer: Self-pay | Admitting: Nurse Practitioner

## 2014-02-16 ENCOUNTER — Encounter: Payer: Self-pay | Admitting: Nurse Practitioner

## 2014-02-16 LAB — BASIC METABOLIC PANEL
BUN: 20 mg/dL (ref 4–21)
Creatinine: 1.4 mg/dL — AB (ref 0.5–1.1)
GLUCOSE: 109 mg/dL
POTASSIUM: 4 mmol/L (ref 3.4–5.3)
Sodium: 141 mmol/L (ref 137–147)

## 2014-02-19 LAB — BASIC METABOLIC PANEL
BUN: 23 mg/dL — AB (ref 4–21)
Creatinine: 1.3 mg/dL — AB (ref 0.5–1.1)
Glucose: 92 mg/dL
Potassium: 3.7 mmol/L (ref 3.4–5.3)
Sodium: 141 mmol/L (ref 137–147)

## 2014-02-20 ENCOUNTER — Other Ambulatory Visit: Payer: Self-pay | Admitting: Nurse Practitioner

## 2014-02-20 DIAGNOSIS — N183 Chronic kidney disease, stage 3 unspecified: Secondary | ICD-10-CM

## 2014-02-20 DIAGNOSIS — I5032 Chronic diastolic (congestive) heart failure: Secondary | ICD-10-CM

## 2014-02-23 ENCOUNTER — Encounter: Payer: Self-pay | Admitting: Nurse Practitioner

## 2014-02-23 ENCOUNTER — Non-Acute Institutional Stay (SKILLED_NURSING_FACILITY): Payer: Medicare Other | Admitting: Nurse Practitioner

## 2014-02-23 DIAGNOSIS — M4806 Spinal stenosis, lumbar region: Secondary | ICD-10-CM

## 2014-02-23 DIAGNOSIS — E039 Hypothyroidism, unspecified: Secondary | ICD-10-CM

## 2014-02-23 DIAGNOSIS — I10 Essential (primary) hypertension: Secondary | ICD-10-CM

## 2014-02-23 DIAGNOSIS — N183 Chronic kidney disease, stage 3 unspecified: Secondary | ICD-10-CM

## 2014-02-23 DIAGNOSIS — M48061 Spinal stenosis, lumbar region without neurogenic claudication: Secondary | ICD-10-CM

## 2014-02-23 DIAGNOSIS — R0602 Shortness of breath: Secondary | ICD-10-CM

## 2014-02-23 DIAGNOSIS — R609 Edema, unspecified: Secondary | ICD-10-CM

## 2014-02-23 DIAGNOSIS — I480 Paroxysmal atrial fibrillation: Secondary | ICD-10-CM

## 2014-02-23 DIAGNOSIS — B370 Candidal stomatitis: Secondary | ICD-10-CM

## 2014-02-23 DIAGNOSIS — K5900 Constipation, unspecified: Secondary | ICD-10-CM

## 2014-02-23 DIAGNOSIS — I5032 Chronic diastolic (congestive) heart failure: Secondary | ICD-10-CM

## 2014-02-23 NOTE — Assessment & Plan Note (Signed)
Heart rate is in control, takes Atenolol 25mg daily, has pacemaker which is functional. Stopped Elliquis due to GI bleed.     

## 2014-02-23 NOTE — Assessment & Plan Note (Signed)
Controlled.  

## 2014-02-23 NOTE — Assessment & Plan Note (Signed)
MiraLax daily, controlled.  

## 2014-02-23 NOTE — Assessment & Plan Note (Signed)
[id number] [phone] [id number]<BADTEMckenzie Surgery Center LPXTTAG>

## 2014-02-23 NOTE — Assessment & Plan Note (Signed)
Continue Norco 7/325mg q4h and q4h prn. Off Tramadol.    

## 2014-02-23 NOTE — Assessment & Plan Note (Signed)
Improved. Continue Magic mouth wash.

## 2014-02-23 NOTE — Assessment & Plan Note (Signed)
Reportedly worse BLE, will weight 2x/wk and have Furosemide 40mg  2x/wk prn for weight gain 2-3 Ibs. Adding daily Furosemide 20mg  K 10meq po-observe.

## 2014-02-23 NOTE — Assessment & Plan Note (Signed)
02/05/14 BNP 460.3 02/16/14 BNP 421.5 02/19/14 BNP 489.2 02/23/14 adding Furosemide 20mg  and Kcl 10meq po daily. Observe the patient.

## 2014-02-23 NOTE — Assessment & Plan Note (Signed)
Takes Synthroid 125mcg daily.  12/28/13 TSH 2.01 02/05/14 TSH 2.68  

## 2014-02-23 NOTE — Progress Notes (Signed)
Patient ID: Sabrina Bishop, female   DOB: 1916/12/09, 80 y.o.   MRN: [id number]   Code Status: DNR, MOST  Allergies  Allergen Reactions  . Vioxx [Rofecoxib] Other (See Comments)    Pt doesn't remember  . Adhesive [Tape] Rash    Chief Complaint  Patient presents with  . Medical Management of Chronic Issues  . Acute Visit    sore mouth, SOB    HPI: Patient is a 80 y.o. female seen in the SNF at [facility] today for evaluation of Sore mouth, moist cough, worsened edema BLE,  and other chronic medical conditions.  Problem List Items Addressed This Visit    Spinal stenosis, lumbar region, without neurogenic claudication    Continue Norco 7/325mg  q4h and q4h prn. Off Tramadol.      Shortness of breath - Primary (Chronic)    Continue O2 2lpm, Ativan q4h prn, Oxycodone 5mg  q4h prn. Weight 2x/week-may Furosemide 20mg  daily+K po daily.  Moist cough and worsened edema BLE likely related to CHF.        Oral candidiasis    Improved. Continue Magic mouth wash.      Hypothyroidism    Takes Synthroid daily. 12/28/13 TSH 2.01 02/05/14 TSH 2.68     Essential hypertension    Controlled.      Edema (Chronic)    Reportedly worse BLE, will weight 2x/wk and have Furosemide 40mg  2x/wk prn for weight gain 2-3 Ibs. Adding daily Furosemide 20mg  K po-observe.      DIASTOLIC HEART FAILURE, CHRONIC (Chronic)    02/05/14 BNP 460.3 02/16/14 BNP 421.5 02/19/14 BNP 489.2 02/23/14 adding Furosemide 20mg  and Kcl po daily. Observe the patient.      Constipation    MiraLax daily, controlled.      Chronic kidney disease, stage III (moderate)    02/05/14 Bun/creat 25/1.38, continue Furosemide.       Atrial fibrillation (Chronic)    Heart rate is in control, takes Atenolol 25mg  daily, has pacemaker which is functional. Stopped Elliquis due to GI bleed.           Review of Systems:  Review of Systems  Constitutional: Positive for malaise/fatigue.  Negative for fever and chills.  HENT:       C/o burning sensation of tongue-better   Respiratory: Positive for cough, shortness of breath and wheezing. Negative for hemoptysis and sputum production.        Moist cough  Cardiovascular: Positive for orthopnea, leg swelling and PND. Negative for chest pain, palpitations and claudication.       Worsened edema BLE  Gastrointestinal: Positive for constipation. Negative for abdominal pain.  Genitourinary: Negative for dysuria.  Musculoskeletal: Negative for falls.  Skin: Negative for itching and rash.       Patchy pruritic rash upper chest-better  Neurological: Negative for dizziness and headaches.  Psychiatric/Behavioral: Negative for depression.     Past Medical History  Diagnosis Date  . Hypothyroidism   . OA (osteoarthritis)   . HTN (hypertension)   . Diastolic dysfunction   . AV BLOCK, COMPLETE 03/26/2009    Qualifier: Diagnosis of  By: Graciela Husbands, MD, Ruthann Cancer Ty Hilts   . Pacemaker-mdt DDD 12/16/2010  . Keratoconus, unspecified   . Disturbance of salivary secretion   . GERD (gastroesophageal reflux disease)   . Osteoarthrosis, unspecified whether generalized or localized, unspecified site   . Pain in joint, ankle and foot   . Spinal stenosis, lumbar region, without neurogenic claudication   .  Lumbago   . Trigger finger (acquired)   . Abnormality of gait   . Edema   . Dysphagia, unspecified(787.20)   . Seborrheic keratosis 05/2012  . Other atopic dermatitis and related conditions 03/2012  . Unspecified diastolic heart failure 02/16/2012  . Mixed hyperlipidemia 08/2011  . Chronic kidney disease, stage II (mild) 08/2011  . Hypoxemia 08/2011  . Enthesopathy of hip region 01/2011  . Keratoconjunctivitis 2011  . Atrial fibrillation 02/15/2013  . Unspecified venous (peripheral) insufficiency 2010  . Pain in joint, shoulder region 2015    left  . Keratoacanthoma of hand 2015    left  . Actinic keratoses 2015    right arm and left  cheek   Past Surgical History  Procedure Laterality Date  . Total knee arthroplasty  2005    left  . Abdominal hysterectomy  1950  . Pacemaker generator change  04/2012    Dr. Graciela HusbandsKlein  . Pacemaker placement  2004  . Total knee arthroplasty  1995    right  . Cataract extraction w/ intraocular lens implant  2012    right  . Esophagogastroduodenoscopy N/A 01/20/2014    Procedure: ESOPHAGOGASTRODUODENOSCOPY (EGD);  Surgeon: Shirley FriarVincent C. Schooler, MD;  [facility];  Service: Endoscopy;  Laterality: N/A;   Social History:   reports that she has never smoked. She has never used smokeless tobacco. She reports that she does not drink alcohol or use illicit drugs.  Family History  Problem Relation Age of Onset  . Hyperlipidemia    . Hypertension    . Osteoporosis    . Stroke Father   . Congestive Heart Failure Brother   . Congestive Heart Failure Brother     Medications: Patient's Medications  New Prescriptions   No medications on file  Previous Medications   AMOXICILLIN (AMOXIL) 500 MG CAPSULE    Take 2,000 mg by mouth once as needed (prior to dental appointments).   ATENOLOL (TENORMIN) 50 MG TABLET    Take 0.5 tablets (25 mg total) by mouth daily.   GERIATRIC MULTIVITAMINS-MINERALS (ELDERTONIC/GEVRABON) ELIX    Take 15 mLs by mouth daily.   HYDROCODONE-ACETAMINOPHEN (NORCO) 7.5-325 MG PER TABLET    Take one tablet by mouth three times daily for pain   ISOSORBIDE MONONITRATE (IMDUR) 30 MG 24 HR TABLET    Take 0.5 tablets (15 mg total) by mouth daily.   LEVOTHYROXINE (SYNTHROID, LEVOTHROID) 125 MCG TABLET    Take 1 tablet (125 mcg total) by mouth daily before breakfast.   LORAZEPAM (ATIVAN) 1 MG TABLET    Take one tablet by mouth every 4 hours as needed for anxiety or agitation   OXYCODONE (ROXICODONE INTENSOL) 20 MG/ML CONCENTRATED SOLUTION    Take 5 mg by mouth every 4 (four) hours as needed for severe pain.   PANTOPRAZOLE (PROTONIX) 40 MG TABLET    Take 1 tablet (40 mg total)  by mouth daily.   VITAMIN C (VITAMIN C) 500 MG TABLET    Take 1 tablet (500 mg total) by mouth daily.  Modified Medications   No medications on file  Discontinued Medications   No medications on file     Physical Exam: Physical Exam  Constitutional: She is oriented to person, place, and time. No distress.  frail  HENT:  Head: Normocephalic and atraumatic.  Partial deafness. Inflamed tongue no lesions noted.   Eyes: Pupils are equal, round, and reactive to light.  Neck: Normal range of motion. Neck supple. No JVD present. No tracheal deviation  present. No thyromegaly present.  Cardiovascular: Normal rate, regular rhythm, normal heart sounds and intact distal pulses.  Exam reveals no gallop and no friction rub.   No murmur heard. Pulmonary/Chest: Effort normal. No respiratory distress. She has wheezes. She has rales. She exhibits no tenderness.  Moist rales diffused.   Abdominal: Bowel sounds are normal. She exhibits no distension and no mass. There is no tenderness.  Musculoskeletal: Normal range of motion. She exhibits edema. She exhibits no tenderness.  Using power chair and cane. BLE edema 2+ LLE>RLE  Lymphadenopathy:    She has no cervical adenopathy.  Neurological: She is alert and oriented to person, place, and time. No cranial nerve deficit.  Skin: Skin is warm and dry. No rash noted. No erythema. No pallor.  Senile ecchymoses. Patchy pruritic rash upper chest.    Psychiatric: She has a normal mood and affect. Her behavior is normal. Thought content normal.    Filed Vitals:   02/23/14 1513  BP: 124/62  Pulse: 64  Temp: 97.2 F (36.2 C)  TempSrc: Tympanic  Resp: 18      Labs reviewed: Basic Metabolic Panel:  Recent Labs  16/10/96  01/19/14 1755  01/21/14 0235 01/22/14 0035 01/22/14 2350  02/05/14 02/12/14 02/16/14 02/19/14  NA 136*  < >  --   < > 136* 136* 139  < > 142 141 141 141  K 4.2  < >  --   < > 5.0 5.1 5.5*  < > 3.6 3.7 4.0 3.7  CL  --   < >  --    < > 104 105 108  --   --   --   --   --   CO2  --   < >  --   < > 17* 17* 17*  --   --   --   --   --   GLUCOSE  --   < >  --   < > 82 93 126*  --   --   --   --   --   BUN 43*  < >  --   < > 43* 38* 36*  < > 25* 30* 20 23*  CREATININE 1.6*  < >  --   < > 1.68* 1.62* 1.95*  < > 1.4* 1.5* 1.4* 1.3*  CALCIUM  --   < >  --   < > 8.3* 8.5 8.5  --   --   --   --   --   TSH 2.01  --  10.510*  --   --   --   --   --  2.68  --   --   --   < > = values in this interval not displayed. Liver Function Tests:  Recent Labs  01/10/14 1541 01/20/14 0720 01/21/14 0235 01/29/14  AST 26 86* 45* 34  ALT 15 52* 36* 36*  ALKPHOS 54 89 73 87  BILITOT 0.8 0.6 0.5  --   PROT 6.7 6.3 5.5*  --   ALBUMIN 3.7 3.0* 2.7*  --    No results for input(s): LIPASE, AMYLASE in the last 8760 hours. No results for input(s): AMMONIA in the last 8760 hours. CBC:  Recent Labs  01/10/14 1541  01/21/14 0235 01/22/14 0035 01/22/14 2350 01/29/14  WBC 6.4  < > 7.3 6.6 6.5 10.7  NEUTROABS 4.6  --   --   --   --   --   HGB 10.0*  < >  10.7* 11.2* 11.4* 11.5*  HCT 29.9*  < > 32.2* 34.5* 35.7* 36  MCV 92.4  < > 87.0 88.2 92.5  --   PLT 185.0  < > 184 175 174 160  < > = values in this interval not displayed. Lipid Panel:  Recent Labs  05/29/13  CHOL 136  HDL 43  LDLCALC 74  TRIG 93    Past Procedures:  01/19/14 CXR   IMPRESSION:  1. Stable cardiomegaly. Pulmonary vascularity is normal. Cardiac  pacer noted with lead tips in the right atrium and right ventricle.  2. Mild bibasilar subsegmental axis with small bilateral pleural  effusions.  3. Stable lumbar compression fractures. Diffuse thoracolumbar spine  osteopenia and degenerative change.    Assessment/Plan Shortness of breath Continue O2 2lpm, Ativan q4h prn, Oxycodone 5mg  q4h prn. Weight 2x/week-may Furosemide 20mg  daily+K 10meq po daily.  Moist cough and worsened edema BLE likely related to CHF.      Spinal stenosis, lumbar region, without  neurogenic claudication Continue Norco 7/325mg  q4h and q4h prn. Off Tramadol.    Oral candidiasis Improved. Continue Magic mouth wash.    Hypothyroidism Takes Synthroid 125mcg daily. 12/28/13 TSH 2.01 02/05/14 TSH 2.68   Essential hypertension Controlled.    Edema Reportedly worse BLE, will weight 2x/wk and have Furosemide 40mg  2x/wk prn for weight gain 2-3 Ibs. Adding daily Furosemide 20mg  K 10meq po-observe.    DIASTOLIC HEART FAILURE, CHRONIC 02/05/14 BNP 460.3 02/16/14 BNP 421.5 02/19/14 BNP 489.2 02/23/14 adding Furosemide 20mg  and Kcl 10meq po daily. Observe the patient.    Constipation MiraLax daily, controlled.    Chronic kidney disease, stage III (moderate) 02/05/14 Bun/creat 25/1.38, continue Furosemide.     Atrial fibrillation Heart rate is in control, takes Atenolol 25mg  daily, has pacemaker which is functional. Stopped Elliquis due to GI bleed.        Family/ Staff Communication: observe the patient. Palliative care.   Goals of Care: SNF  Labs/tests ordered: BMP weekly x 4. CXR. Weight x2/week.

## 2014-02-23 NOTE — Assessment & Plan Note (Signed)
02/05/14 Bun/creat 25/1.38, continue Furosemide.   

## 2014-03-02 ENCOUNTER — Encounter: Payer: Self-pay | Admitting: *Deleted

## 2014-03-06 ENCOUNTER — Encounter: Payer: Self-pay | Admitting: Nurse Practitioner

## 2014-03-06 ENCOUNTER — Non-Acute Institutional Stay (SKILLED_NURSING_FACILITY): Payer: Medicare Other | Admitting: Nurse Practitioner

## 2014-03-06 DIAGNOSIS — I482 Chronic atrial fibrillation, unspecified: Secondary | ICD-10-CM

## 2014-03-06 DIAGNOSIS — I10 Essential (primary) hypertension: Secondary | ICD-10-CM

## 2014-03-06 DIAGNOSIS — D62 Acute posthemorrhagic anemia: Secondary | ICD-10-CM

## 2014-03-06 DIAGNOSIS — R0602 Shortness of breath: Secondary | ICD-10-CM

## 2014-03-06 DIAGNOSIS — N183 Chronic kidney disease, stage 3 unspecified: Secondary | ICD-10-CM

## 2014-03-06 DIAGNOSIS — I5032 Chronic diastolic (congestive) heart failure: Secondary | ICD-10-CM

## 2014-03-06 DIAGNOSIS — R609 Edema, unspecified: Secondary | ICD-10-CM

## 2014-03-06 DIAGNOSIS — K5900 Constipation, unspecified: Secondary | ICD-10-CM

## 2014-03-06 DIAGNOSIS — E039 Hypothyroidism, unspecified: Secondary | ICD-10-CM

## 2014-03-06 DIAGNOSIS — M48061 Spinal stenosis, lumbar region without neurogenic claudication: Secondary | ICD-10-CM

## 2014-03-06 DIAGNOSIS — M4806 Spinal stenosis, lumbar region: Secondary | ICD-10-CM

## 2014-03-06 NOTE — Assessment & Plan Note (Signed)
Takes Synthroid 125mcg daily.  12/28/13 TSH 2.01 02/05/14 TSH 2.68  

## 2014-03-06 NOTE — Assessment & Plan Note (Signed)
MiraLax daily, controlled.  

## 2014-03-06 NOTE — Assessment & Plan Note (Signed)
02/05/14 Bun/creat 25/1.38, continue Furosemide.   

## 2014-03-06 NOTE — Assessment & Plan Note (Signed)
Stabilized Hgb: 11.5 01/29/14. Off Iron. No active bleed.   

## 2014-03-06 NOTE — Assessment & Plan Note (Signed)
Continue Norco 7/325mg  q4h and q4h prn. Off Tramadol.

## 2014-03-06 NOTE — Assessment & Plan Note (Signed)
Continue O2 2lpm, Ativan q4h prn, Oxycodone 5mg  q4h prn. Weight 2x/week-increase Furosemide 40mg  daily+K 10meq po daily for better symptomatic management.  Moist cough and worsened edema BLE likely related to CHF. Update BMP and BNP

## 2014-03-06 NOTE — Assessment & Plan Note (Signed)
Controlled.  

## 2014-03-06 NOTE — Progress Notes (Signed)
Patient ID: Sabrina Bishop, female   DOB: 15-Jul-1916, 80 y.o.   MRN: [id number]   Code Status: DNR, MOST  Allergies  Allergen Reactions  . Vioxx [Rofecoxib] Other (See Comments)    Pt doesn't remember  . Adhesive [Tape] Rash    Chief Complaint  Patient presents with  . Medical Management of Chronic Issues  . Acute Visit    SOB    HPI: Patient is a 80 y.o. female seen in the SNF at [facility] today for evaluation of persisted SOB, edema BLE,  and other chronic medical conditions.  Problem List Items Addressed This Visit    Spinal stenosis, lumbar region, without neurogenic claudication    Continue Norco 7/325mg  q4h and q4h prn. Off Tramadol.       Shortness of breath (Chronic)    Continue O2 2lpm, Ativan q4h prn, Oxycodone 5mg  q4h prn. Weight 2x/week-increase Furosemide 40mg  daily+K po daily for better symptomatic management.  Moist cough and worsened edema BLE likely related to CHF. Update BMP and BNP        Hypothyroidism    Takes Synthroid daily. 12/28/13 TSH 2.01 02/05/14 TSH 2.68      Essential hypertension    Controlled.       Edema (Chronic)    C/o persisted edema BLE and SOB, it seems better upon my examination, increase Furosemide 40mg  K po-BMP/BNP one week.       DIASTOLIC HEART FAILURE, CHRONIC (Chronic)    02/05/14 BNP 460.3 02/16/14 BNP 421.5 02/19/14 BNP 489.2 02/23/14 adding Furosemide 20mg  and Kcl po daily. Observe the patient.  03/06/14 c/o persisted SOB and BLE edema, increase Furosemide to 40mg  daily, update BMP/BNP one week.      Constipation    MiraLax daily, controlled.       Chronic kidney disease, stage III (moderate)    02/05/14 Bun/creat 25/1.38, continue Furosemide.        Atrial fibrillation (Chronic)    Heart rate is in control, takes Atenolol 25mg  daily, has pacemaker which is functional. Stopped Elliquis due to GI bleed.        Acute blood loss anemia - Primary    Stabilized Hgb:  11.5 01/29/14. Off Iron. No active bleed.          Review of Systems:  Review of Systems  Constitutional: Positive for malaise/fatigue. Negative for fever and chills.       Chronic  HENT:       C/o burning sensation of tongue-better   Eyes: Negative for blurred vision, double vision, photophobia, pain, discharge and redness.  Respiratory: Positive for cough and shortness of breath. Negative for hemoptysis, sputum production and wheezing.        Moist cough  Cardiovascular: Positive for orthopnea, leg swelling and PND. Negative for chest pain, palpitations and claudication.       Worsened edema BLE  Gastrointestinal: Positive for constipation. Negative for heartburn, nausea, vomiting, abdominal pain and diarrhea.  Genitourinary: Negative for dysuria, urgency, frequency, hematuria and flank pain.  Musculoskeletal: Positive for joint pain. Negative for back pain, falls and neck pain.  Skin: Negative for itching and rash.  Neurological: Negative for dizziness, tingling, tremors, sensory change, speech change, focal weakness, seizures, loss of consciousness and headaches.  Psychiatric/Behavioral: Positive for memory loss. Negative for depression. The patient is not nervous/anxious and does not have insomnia.      Past Medical History  Diagnosis Date  . Hypothyroidism   . OA (osteoarthritis)   .  HTN (hypertension)   . Diastolic dysfunction   . AV BLOCK, COMPLETE 03/26/2009    Qualifier: Diagnosis of  By: Graciela Husbands, MD, Ruthann Cancer Ty Hilts   . Pacemaker-mdt DDD 12/16/2010  . Keratoconus, unspecified   . Disturbance of salivary secretion   . GERD (gastroesophageal reflux disease)   . Osteoarthrosis, unspecified whether generalized or localized, unspecified site   . Pain in joint, ankle and foot   . Spinal stenosis, lumbar region, without neurogenic claudication   . Lumbago   . Trigger finger (acquired)   . Abnormality of gait   . Edema   . Dysphagia, unspecified(787.20)   .  Seborrheic keratosis 05/2012  . Other atopic dermatitis and related conditions 03/2012  . Unspecified diastolic heart failure 02/16/2012  . Mixed hyperlipidemia 08/2011  . Chronic kidney disease, stage II (mild) 08/2011  . Hypoxemia 08/2011  . Enthesopathy of hip region 01/2011  . Keratoconjunctivitis 2011  . Atrial fibrillation 02/15/2013  . Unspecified venous (peripheral) insufficiency 2010  . Pain in joint, shoulder region 2015    left  . Keratoacanthoma of hand 2015    left  . Actinic keratoses 2015    right arm and left cheek   Past Surgical History  Procedure Laterality Date  . Total knee arthroplasty  2005    left  . Abdominal hysterectomy  1950  . Pacemaker generator change  04/2012    Dr. Graciela Husbands  . Pacemaker placement  2004  . Total knee arthroplasty  1995    right  . Cataract extraction w/ intraocular lens implant  2012    right  . Esophagogastroduodenoscopy N/A 01/20/2014    Procedure: ESOPHAGOGASTRODUODENOSCOPY (EGD);  Surgeon: Shirley Friar, MD;  Location: [facility] ENDOSCOPY;  Service: Endoscopy;  Laterality: N/A;   Social History:   reports that she has never smoked. She has never used smokeless tobacco. She reports that she does not drink alcohol or use illicit drugs.  Family History  Problem Relation Age of Onset  . Hyperlipidemia    . Hypertension    . Osteoporosis    . Stroke Father   . Congestive Heart Failure Brother   . Congestive Heart Failure Brother     Medications: Patient's Medications  New Prescriptions   No medications on file  Previous Medications   AMOXICILLIN (AMOXIL) 500 MG CAPSULE    Take 2,000 mg by mouth once as needed (prior to dental appointments).   ATENOLOL (TENORMIN) 50 MG TABLET    Take 0.5 tablets (25 mg total) by mouth daily.   DIPHENHYD-HYDROCORT-NYSTATIN (FIRST-DUKES MOUTHWASH MT)    Use as directed in the mouth or throat. SWISH AND SPIT 5 ML BY MOUTH BEFORE MEALS AND AT BEDTIME FOR 21 DAY'S   FUROSEMIDE (LASIX) 40 MG TABLET     Take 40 mg by mouth. TAKE ONE TAB BY MOUTH EVERY DAY   GERIATRIC MULTIVITAMINS-MINERALS (ELDERTONIC/GEVRABON) ELIX    Take 15 mLs by mouth daily.   HYDROCODONE-ACETAMINOPHEN (NORCO) 7.5-325 MG PER TABLET    Take one tablet by mouth three times daily for pain   ISOSORBIDE MONONITRATE (IMDUR) 30 MG 24 HR TABLET    Take 0.5 tablets (15 mg total) by mouth daily.   LEVOTHYROXINE (SYNTHROID, LEVOTHROID) 125 MCG TABLET    Take 1 tablet (125 mcg total) by mouth daily before breakfast.   LORAZEPAM (ATIVAN) 1 MG TABLET    Take one tablet by mouth every 4 hours as needed for anxiety or agitation   OMEPRAZOLE (PRILOSEC) 20 MG CAPSULE  Take 20 mg by mouth daily. TAKE 1 CAP BY MOUTH EVERY DAY TAKE 30-60 MINUTES BEFORE EATING   OXYCODONE (ROXICODONE INTENSOL) 20 MG/ML CONCENTRATED SOLUTION    Take 5 mg by mouth every 4 (four) hours as needed for severe pain.   PANTOPRAZOLE (PROTONIX) 40 MG TABLET    Take 1 tablet (40 mg total) by mouth daily.   POLYETHYLENE GLYCOL (MIRALAX / GLYCOLAX) PACKET    Take 17 g by mouth daily. FILL CAP TO 17 GM MARK, MIX WITH 4 OUNCES OF FLUID AND TAKE BY MOUTH EVERY DAY   VITAMIN C (VITAMIN C) 500 MG TABLET    Take 1 tablet (500 mg total) by mouth daily.  Modified Medications   No medications on file  Discontinued Medications   No medications on file     Physical Exam: Physical Exam  Constitutional: She is oriented to person, place, and time. No distress.  frail  HENT:  Head: Normocephalic and atraumatic.  Partial deafness. Inflamed tongue no lesions noted-improved.   Eyes: Pupils are equal, round, and reactive to light.  Neck: Normal range of motion. Neck supple. No JVD present. No tracheal deviation present. No thyromegaly present.  Cardiovascular: Normal rate, regular rhythm, normal heart sounds and intact distal pulses.  Exam reveals no gallop and no friction rub.   No murmur heard. Pulmonary/Chest: Effort normal. No respiratory distress. She has no wheezes. She has  rales. She exhibits no tenderness.  Moist rales diffused-less. Decreased breath sounds R+L  Abdominal: Bowel sounds are normal. She exhibits no distension and no mass. There is no tenderness.  Musculoskeletal: Normal range of motion. She exhibits edema. She exhibits no tenderness.  Using power chair and cane. BLE edema 2+ LLE>RLE  Lymphadenopathy:    She has no cervical adenopathy.  Neurological: She is alert and oriented to person, place, and time. No cranial nerve deficit.  Skin: Skin is warm and dry. No rash noted. No erythema. No pallor.  Senile ecchymoses.    Psychiatric: She has a normal mood and affect. Her behavior is normal. Thought content normal. Cognition and memory are impaired. She exhibits abnormal recent memory.    Filed Vitals:   03/06/14 1236  BP: 148/62  Pulse: 64  Temp: 98.1 F (36.7 C)  TempSrc: Tympanic  Resp: 22      Labs reviewed: Basic Metabolic Panel:  Recent Labs  65/78/4608/02/02  01/19/14 1755  01/21/14 0235 01/22/14 0035 01/22/14 2350  02/05/14 02/12/14 02/16/14 02/19/14  NA 136*  < >  --   < > 136* 136* 139  < > 142 141 141 141  K 4.2  < >  --   < > 5.0 5.1 5.5*  < > 3.6 3.7 4.0 3.7  CL  --   < >  --   < > 104 105 108  --   --   --   --   --   CO2  --   < >  --   < > 17* 17* 17*  --   --   --   --   --   GLUCOSE  --   < >  --   < > 82 93 126*  --   --   --   --   --   BUN 43*  < >  --   < > 43* 38* 36*  < > 25* 30* 20 23*  CREATININE 1.6*  < >  --   < > 1.68* 1.62*  1.95*  < > 1.4* 1.5* 1.4* 1.3*  CALCIUM  --   < >  --   < > 8.3* 8.5 8.5  --   --   --   --   --   TSH 2.01  --  10.510*  --   --   --   --   --  2.68  --   --   --   < > = values in this interval not displayed. Liver Function Tests:  Recent Labs  01/10/14 1541 01/20/14 0720 01/21/14 0235 01/29/14  AST 26 86* 45* 34  ALT 15 52* 36* 36*  ALKPHOS 54 89 73 87  BILITOT 0.8 0.6 0.5  --   PROT 6.7 6.3 5.5*  --   ALBUMIN 3.7 3.0* 2.7*  --    No results for input(s): LIPASE, AMYLASE  in the last 8760 hours. No results for input(s): AMMONIA in the last 8760 hours. CBC:  Recent Labs  01/10/14 1541  01/21/14 0235 01/22/14 0035 01/22/14 2350 01/29/14  WBC 6.4  < > 7.3 6.6 6.5 10.7  NEUTROABS 4.6  --   --   --   --   --   HGB 10.0*  < > 10.7* 11.2* 11.4* 11.5*  HCT 29.9*  < > 32.2* 34.5* 35.7* 36  MCV 92.4  < > 87.0 88.2 92.5  --   PLT 185.0  < > 184 175 174 160  < > = values in this interval not displayed. Lipid Panel:  Recent Labs  05/29/13  CHOL 136  HDL 43  LDLCALC 74  TRIG 93    Past Procedures:  01/19/14 CXR   IMPRESSION:  1. Stable cardiomegaly. Pulmonary vascularity is normal. Cardiac  pacer noted with lead tips in the right atrium and right ventricle.  2. Mild bibasilar subsegmental axis with small bilateral pleural  effusions.  3. Stable lumbar compression fractures. Diffuse thoracolumbar spine  osteopenia and degenerative change.    Assessment/Plan Acute blood loss anemia Stabilized Hgb: 11.5 01/29/14. Off Iron. No active bleed.     Atrial fibrillation Heart rate is in control, takes Atenolol 25mg  daily, has pacemaker which is functional. Stopped Elliquis due to GI bleed.      Chronic kidney disease, stage III (moderate) 02/05/14 Bun/creat 25/1.38, continue Furosemide.      Constipation MiraLax daily, controlled.     DIASTOLIC HEART FAILURE, CHRONIC 02/05/14 BNP 460.3 02/16/14 BNP 421.5 02/19/14 BNP 489.2 02/23/14 adding Furosemide 20mg  and Kcl 10meq po daily. Observe the patient.  03/06/14 c/o persisted SOB and BLE edema, increase Furosemide to 40mg  daily, update BMP/BNP one week.    Edema C/o persisted edema BLE and SOB, it seems better upon my examination, increase Furosemide 40mg  K 10meq po-BMP/BNP one week.     Essential hypertension Controlled.     Hypothyroidism Takes Synthroid 125mcg daily. 12/28/13 TSH 2.01 02/05/14 TSH 2.68    Shortness of breath Continue O2 2lpm, Ativan q4h prn, Oxycodone 5mg  q4h  prn. Weight 2x/week-increase Furosemide 40mg  daily+K 10meq po daily for better symptomatic management.  Moist cough and worsened edema BLE likely related to CHF. Update BMP and BNP      Spinal stenosis, lumbar region, without neurogenic claudication Continue Norco 7/325mg  q4h and q4h prn. Off Tramadol.       Family/ Staff Communication: observe the patient. Palliative care.   Goals of Care: SNF  Labs/tests ordered: BMP and BNP one week.

## 2014-03-06 NOTE — Assessment & Plan Note (Signed)
02/05/14 BNP 460.3 02/16/14 BNP 421.5 02/19/14 BNP 489.2 02/23/14 adding Furosemide 20mg  and Kcl 10meq po daily. Observe the patient.  03/06/14 c/o persisted SOB and BLE edema, increase Furosemide to 40mg  daily, update BMP/BNP one week.

## 2014-03-06 NOTE — Assessment & Plan Note (Signed)
Heart rate is in control, takes Atenolol 25mg  daily, has pacemaker which is functional. Stopped Elliquis due to GI bleed.

## 2014-03-06 NOTE — Assessment & Plan Note (Signed)
C/o persisted edema BLE and SOB, it seems better upon my examination, increase Furosemide 40mg  K 10meq po-BMP/BNP one week.

## 2014-03-07 ENCOUNTER — Encounter: Payer: Medicare Other | Admitting: Internal Medicine

## 2014-03-12 LAB — BASIC METABOLIC PANEL
BUN: 21 mg/dL (ref 4–21)
CREATININE: 1.2 mg/dL — AB (ref 0.5–1.1)
GLUCOSE: 100 mg/dL
Potassium: 4.1 mmol/L (ref 3.4–5.3)
Sodium: 140 mmol/L (ref 137–147)

## 2014-03-13 ENCOUNTER — Other Ambulatory Visit: Payer: Self-pay | Admitting: Nurse Practitioner

## 2014-03-20 ENCOUNTER — Non-Acute Institutional Stay (SKILLED_NURSING_FACILITY): Payer: Medicare Other | Admitting: Nurse Practitioner

## 2014-03-20 ENCOUNTER — Encounter: Payer: Self-pay | Admitting: Nurse Practitioner

## 2014-03-20 DIAGNOSIS — R062 Wheezing: Secondary | ICD-10-CM | POA: Insufficient documentation

## 2014-03-20 DIAGNOSIS — I10 Essential (primary) hypertension: Secondary | ICD-10-CM

## 2014-03-20 DIAGNOSIS — I482 Chronic atrial fibrillation, unspecified: Secondary | ICD-10-CM

## 2014-03-20 DIAGNOSIS — R609 Edema, unspecified: Secondary | ICD-10-CM

## 2014-03-20 DIAGNOSIS — N183 Chronic kidney disease, stage 3 unspecified: Secondary | ICD-10-CM

## 2014-03-20 DIAGNOSIS — M76899 Other specified enthesopathies of unspecified lower limb, excluding foot: Secondary | ICD-10-CM

## 2014-03-20 DIAGNOSIS — K5900 Constipation, unspecified: Secondary | ICD-10-CM

## 2014-03-20 DIAGNOSIS — R627 Adult failure to thrive: Secondary | ICD-10-CM

## 2014-03-20 DIAGNOSIS — E039 Hypothyroidism, unspecified: Secondary | ICD-10-CM

## 2014-03-20 DIAGNOSIS — I5032 Chronic diastolic (congestive) heart failure: Secondary | ICD-10-CM

## 2014-03-20 DIAGNOSIS — K274 Chronic or unspecified peptic ulcer, site unspecified, with hemorrhage: Secondary | ICD-10-CM

## 2014-03-20 DIAGNOSIS — R0602 Shortness of breath: Secondary | ICD-10-CM

## 2014-03-20 DIAGNOSIS — M4806 Spinal stenosis, lumbar region: Secondary | ICD-10-CM

## 2014-03-20 DIAGNOSIS — M48061 Spinal stenosis, lumbar region without neurogenic claudication: Secondary | ICD-10-CM

## 2014-03-20 NOTE — Progress Notes (Signed)
Patient ID: Sabrina Bishop, female   DOB: Feb 26, 1917, 80 y.o.   MRN: [id number]   Code Status: DNR, MOST  Allergies  Allergen Reactions  . Vioxx [Rofecoxib] Other (See Comments)    Pt doesn't remember  . Adhesive [Tape] Rash    Chief Complaint  Patient presents with  . Medical Management of Chronic Issues  . Acute Visit    cough and SOB    HPI: Patient is a 80 y.o. female seen in the SNF at [facility] today for evaluation of persisted SOB, cough,  edema BLE,  and other chronic medical conditions.  Problem List Items Addressed This Visit    Wheezing    Cardiac and pulmonary etiologies: change DuoNeb to tid, adding Spironolactone for better symptomatic management of CHF, update BMP, adding Prednisone 10mg  for persisted cough/wheezes.     Spinal stenosis, lumbar region, without neurogenic claudication    Pain is reasonably managed with Norco 7.5/325mg  tid. Prn Oxycodone available to her.     Shortness of breath (Chronic)    03/19/14 CXR enlarged heart with moderate congestive heart failure, airspace opacities lower lungs bilaterally consistent with superimposed inflammatory process or pneumonia as well as atelectatic change.  03/20/14 O2 sat 97%, RR 20, T 97.1-will continue to observe the patient     Hypothyroidism (Chronic)    Takes Synthroid daily. 12/28/13 TSH 2.01 02/05/14 TSH 2.68      GI bleed    Stable. Continue Omeprazole 20mg  daily.     FTT (failure to thrive) in adult    Comfort measures for her. Lorazepam and Oxycodone prn available to her.     Essential hypertension (Chronic)    Controlled. Takes Imdur and Atenolol.      Relevant Medications      spironolactone (ALDACTONE) 25 MG tablet   Enthesopathy of hip region    Pain is controlled with Norco 7.5/325mg  tid.     Edema (Chronic)    C/o persisted edema BLE and SOB, will continue Furosemide 40mg  and add Spironolactone 25mg . Daily. Update BMP one week.       DIASTOLIC HEART FAILURE,  CHRONIC - Primary (Chronic)    02/05/14 BNP 460.3 02/16/14 BNP 421.5 02/19/14 BNP 489.2 03/12/14 BNP 424.6 03/19/14 CXR enlarged heart with moderate congestive heart failure, airspace opacities lower lungs bilaterally consistent with superimposed inflammatory process or pneumonia as well as atelectatic change. Additional Furosemide 20mg  x 3 days. Then dc Kcl-adding Spironolactone 25mg  daily.     Relevant Medications      spironolactone (ALDACTONE) 25 MG tablet   Constipation    MiraLax daily, controlled.      Chronic kidney disease, stage III (moderate)    Update BMP one week after adjusting diuretics.     Atrial fibrillation (Chronic)    Heart rate is in control, takes Atenolol 25mg  daily, has pacemaker which is functional. Stopped Elliquis due to GI bleed.        Relevant Medications      spironolactone (ALDACTONE) 25 MG tablet      Review of Systems:  Review of Systems  Constitutional: Positive for malaise/fatigue. Negative for fever and chills.       Chronic  HENT:       C/o burning sensation of tongue-better   Eyes: Negative for blurred vision, double vision, photophobia, pain, discharge and redness.  Respiratory: Positive for cough, shortness of breath and wheezing. Negative for hemoptysis and sputum production.        Moist cough  Cardiovascular:  Positive for orthopnea, leg swelling and PND. Negative for chest pain, palpitations and claudication.       Worsened edema BLE  Gastrointestinal: Positive for constipation. Negative for heartburn, nausea, vomiting, abdominal pain and diarrhea.  Genitourinary: Negative for dysuria, urgency, frequency, hematuria and flank pain.  Musculoskeletal: Positive for joint pain. Negative for back pain, falls and neck pain.  Skin: Negative for itching and rash.  Neurological: Negative for dizziness, tingling, tremors, sensory change, speech change, focal weakness, seizures, loss of consciousness and headaches.  Psychiatric/Behavioral:  Positive for memory loss. Negative for depression. The patient is not nervous/anxious and does not have insomnia.      Past Medical History  Diagnosis Date  . Hypothyroidism   . OA (osteoarthritis)   . HTN (hypertension)   . Diastolic dysfunction   . AV BLOCK, COMPLETE 03/26/2009    Qualifier: Diagnosis of  By: Graciela Husbands, MD, Ruthann Cancer Ty Hilts   . Pacemaker-mdt DDD 12/16/2010  . Keratoconus, unspecified   . Disturbance of salivary secretion   . GERD (gastroesophageal reflux disease)   . Osteoarthrosis, unspecified whether generalized or localized, unspecified site   . Pain in joint, ankle and foot   . Spinal stenosis, lumbar region, without neurogenic claudication   . Lumbago   . Trigger finger (acquired)   . Abnormality of gait   . Edema   . Dysphagia, unspecified(787.20)   . Seborrheic keratosis 05/2012  . Other atopic dermatitis and related conditions 03/2012  . Unspecified diastolic heart failure 02/16/2012  . Mixed hyperlipidemia 08/2011  . Chronic kidney disease, stage II (mild) 08/2011  . Hypoxemia 08/2011  . Enthesopathy of hip region 01/2011  . Keratoconjunctivitis 2011  . Atrial fibrillation 02/15/2013  . Unspecified venous (peripheral) insufficiency 2010  . Pain in joint, shoulder region 2015    left  . Keratoacanthoma of hand 2015    left  . Actinic keratoses 2015    right arm and left cheek   Past Surgical History  Procedure Laterality Date  . Total knee arthroplasty  2005    left  . Abdominal hysterectomy  1950  . Pacemaker generator change  04/2012    Dr. Graciela Husbands  . Pacemaker placement  2004  . Total knee arthroplasty  1995    right  . Cataract extraction w/ intraocular lens implant  2012    right  . Esophagogastroduodenoscopy N/A 01/20/2014    Procedure: ESOPHAGOGASTRODUODENOSCOPY (EGD);  Surgeon: Shirley Friar, MD;  Location: [facility] ENDOSCOPY;  Service: Endoscopy;  Laterality: N/A;   Social History:   reports that she has never smoked. She has never  used smokeless tobacco. She reports that she does not drink alcohol or use illicit drugs.  Family History  Problem Relation Age of Onset  . Hyperlipidemia    . Hypertension    . Osteoporosis    . Stroke Father   . Congestive Heart Failure Brother   . Congestive Heart Failure Brother     Medications: Patient's Medications  New Prescriptions   No medications on file  Previous Medications   AMOXICILLIN (AMOXIL) 500 MG CAPSULE    Take 2,000 mg by mouth once as needed (prior to dental appointments).   ATENOLOL (TENORMIN) 50 MG TABLET    Take 0.5 tablets (25 mg total) by mouth daily.   ATORVASTATIN (LIPITOR) 10 MG TABLET       DIPHENHYD-HYDROCORT-NYSTATIN (FIRST-DUKES MOUTHWASH MT)    Use as directed in the mouth or throat. SWISH AND SPIT 5 ML BY MOUTH BEFORE MEALS  AND AT BEDTIME FOR 21 DAY'S   FUROSEMIDE (LASIX) 40 MG TABLET    Take 40 mg by mouth. TAKE ONE TAB BY MOUTH EVERY DAY   GERIATRIC MULTIVITAMINS-MINERALS (ELDERTONIC/GEVRABON) ELIX    Take 15 mLs by mouth daily.   HYDROCODONE-ACETAMINOPHEN (NORCO) 7.5-325 MG PER TABLET    Take one tablet by mouth three times daily for pain   ISOSORBIDE MONONITRATE (IMDUR) 30 MG 24 HR TABLET    Take 0.5 tablets (15 mg total) by mouth daily.   LEVOTHYROXINE (SYNTHROID, LEVOTHROID) 100 MCG TABLET       LEVOTHYROXINE (SYNTHROID, LEVOTHROID) 125 MCG TABLET    Take 1 tablet (125 mcg total) by mouth daily before breakfast.   LORAZEPAM (ATIVAN) 1 MG TABLET    Take one tablet by mouth every 4 hours as needed for anxiety or agitation   OMEPRAZOLE (PRILOSEC) 20 MG CAPSULE    Take 20 mg by mouth daily. TAKE 1 CAP BY MOUTH EVERY DAY TAKE 30-60 MINUTES BEFORE EATING   OXYCODONE (ROXICODONE INTENSOL) 20 MG/ML CONCENTRATED SOLUTION    Take 5 mg by mouth every 4 (four) hours as needed for severe pain.   PANTOPRAZOLE (PROTONIX) 40 MG TABLET    Take 1 tablet (40 mg total) by mouth daily.   POLYETHYLENE GLYCOL (MIRALAX / GLYCOLAX) PACKET    Take 17 g by mouth daily.  FILL CAP TO 17 GM MARK, MIX WITH 4 OUNCES OF FLUID AND TAKE BY MOUTH EVERY DAY   SPIRONOLACTONE (ALDACTONE) 25 MG TABLET    Take 25 mg by mouth daily.   VITAMIN C (VITAMIN C) 500 MG TABLET    Take 1 tablet (500 mg total) by mouth daily.  Modified Medications   No medications on file  Discontinued Medications   No medications on file     Physical Exam: Physical Exam  Constitutional: She is oriented to person, place, and time. No distress.  frail  HENT:  Head: Normocephalic and atraumatic.  Partial deafness. Inflamed tongue no lesions noted-improved.   Eyes: Pupils are equal, round, and reactive to light.  Neck: Normal range of motion. Neck supple. No JVD present. No tracheal deviation present. No thyromegaly present.  Cardiovascular: Normal rate, regular rhythm, normal heart sounds and intact distal pulses.  Exam reveals no gallop and no friction rub.   No murmur heard. Pulmonary/Chest: Effort normal. No respiratory distress. She has wheezes. She has rales. She exhibits no tenderness.  Moist rales diffused-less. Decreased breath sounds R+L  Abdominal: Bowel sounds are normal. She exhibits no distension and no mass. There is no tenderness.  Musculoskeletal: Normal range of motion. She exhibits edema. She exhibits no tenderness.  Using power chair and cane. BLE edema 2+ LLE>RLE  Lymphadenopathy:    She has no cervical adenopathy.  Neurological: She is alert and oriented to person, place, and time. No cranial nerve deficit.  Skin: Skin is warm and dry. No rash noted. No erythema. No pallor.  Senile ecchymoses.    Psychiatric: She has a normal mood and affect. Her behavior is normal. Thought content normal. Cognition and memory are impaired. She exhibits abnormal recent memory.    Filed Vitals:   03/20/14 0956  BP: 148/84  Pulse: 62  Temp: 97.1 F (36.2 C)  TempSrc: Tympanic  Resp: 20      Labs reviewed: Basic Metabolic Panel:  Recent Labs  16/01/9608/10/15  01/19/14 1755   01/21/14 0235 01/22/14 0035 01/22/14 2350  02/05/14  02/16/14 02/19/14 03/12/14  NA 136*  < >  --   < >  136* 136* 139  < > 142  < > 141 141 140  K 4.2  < >  --   < > 5.0 5.1 5.5*  < > 3.6  < > 4.0 3.7 4.1  CL  --   < >  --   < > 104 105 108  --   --   --   --   --   --   CO2  --   < >  --   < > 17* 17* 17*  --   --   --   --   --   --   GLUCOSE  --   < >  --   < > 82 93 126*  --   --   --   --   --   --   BUN 43*  < >  --   < > 43* 38* 36*  < > 25*  < > 20 23* 21  CREATININE 1.6*  < >  --   < > 1.68* 1.62* 1.95*  < > 1.4*  < > 1.4* 1.3* 1.2*  CALCIUM  --   < >  --   < > 8.3* 8.5 8.5  --   --   --   --   --   --   TSH 2.01  --  10.510*  --   --   --   --   --  2.68  --   --   --   --   < > = values in this interval not displayed. Liver Function Tests:  Recent Labs  01/10/14 1541 01/20/14 0720 01/21/14 0235 01/29/14  AST 26 86* 45* 34  ALT 15 52* 36* 36*  ALKPHOS 54 89 73 87  BILITOT 0.8 0.6 0.5  --   PROT 6.7 6.3 5.5*  --   ALBUMIN 3.7 3.0* 2.7*  --    No results for input(s): LIPASE, AMYLASE in the last 8760 hours. No results for input(s): AMMONIA in the last 8760 hours. CBC:  Recent Labs  01/10/14 1541  01/21/14 0235 01/22/14 0035 01/22/14 2350 01/29/14  WBC 6.4  < > 7.3 6.6 6.5 10.7  NEUTROABS 4.6  --   --   --   --   --   HGB 10.0*  < > 10.7* 11.2* 11.4* 11.5*  HCT 29.9*  < > 32.2* 34.5* 35.7* 36  MCV 92.4  < > 87.0 88.2 92.5  --   PLT 185.0  < > 184 175 174 160  < > = values in this interval not displayed. Lipid Panel:  Recent Labs  05/29/13  CHOL 136  HDL 43  LDLCALC 74  TRIG 93    Past Procedures:  01/19/14 CXR   IMPRESSION:  1. Stable cardiomegaly. Pulmonary vascularity is normal. Cardiac  pacer noted with lead tips in the right atrium and right ventricle.  2. Mild bibasilar subsegmental axis with small bilateral pleural  effusions.  3. Stable lumbar compression fractures. Diffuse thoracolumbar spine  osteopenia and degenerative change.     Assessment/Plan DIASTOLIC HEART FAILURE, CHRONIC 02/05/14 BNP 460.3 02/16/14 BNP 421.5 02/19/14 BNP 489.2 03/12/14 BNP 424.6 03/19/14 CXR enlarged heart with moderate congestive heart failure, airspace opacities lower lungs bilaterally consistent with superimposed inflammatory process or pneumonia as well as atelectatic change. Additional Furosemide 20mg  x 3 days. Then dc Kcl-adding Spironolactone 25mg  daily.   Shortness of breath 03/19/14 CXR enlarged heart with moderate congestive heart failure, airspace opacities  lower lungs bilaterally consistent with superimposed inflammatory process or pneumonia as well as atelectatic change.  03/20/14 O2 sat 97%, RR 20, T 97.1-will continue to observe the patient   Wheezing Cardiac and pulmonary etiologies: change DuoNeb to tid, adding Spironolactone for better symptomatic management of CHF, update BMP, adding Prednisone 10mg  for persisted cough/wheezes.   Hypothyroidism Takes Synthroid 125mcg daily. 12/28/13 TSH 2.01 02/05/14 TSH 2.68    GI bleed Stable. Continue Omeprazole 20mg  daily.   Atrial fibrillation Heart rate is in control, takes Atenolol 25mg  daily, has pacemaker which is functional. Stopped Elliquis due to GI bleed.      Constipation MiraLax daily, controlled.    Enthesopathy of hip region Pain is controlled with Norco 7.5/325mg  tid.   Spinal stenosis, lumbar region, without neurogenic claudication Pain is reasonably managed with Norco 7.5/325mg  tid. Prn Oxycodone available to her.   FTT (failure to thrive) in adult Comfort measures for her. Lorazepam and Oxycodone prn available to her.   Essential hypertension Controlled. Takes Imdur and Atenolol.    Edema C/o persisted edema BLE and SOB, will continue Furosemide 40mg  and add Spironolactone 25mg . Daily. Update BMP one week.     Chronic kidney disease, stage III (moderate) Update BMP one week after adjusting diuretics.     Family/ Staff Communication:  observe the patient. Palliative care.   Goals of Care: SNF  Labs/tests ordered: BMP one week.

## 2014-03-20 NOTE — Assessment & Plan Note (Signed)
Pain is controlled with Norco 7.5/325mg  tid.

## 2014-03-20 NOTE — Assessment & Plan Note (Signed)
MiraLax daily, controlled.  

## 2014-03-20 NOTE — Assessment & Plan Note (Addendum)
02/05/14 BNP 460.3 02/16/14 BNP 421.5 02/19/14 BNP 489.2 03/12/14 BNP 424.6 03/19/14 CXR enlarged heart with moderate congestive heart failure, airspace opacities lower lungs bilaterally consistent with superimposed inflammatory process or pneumonia as well as atelectatic change. Additional Furosemide 20mg  x 3 days. Then dc Kcl-adding Spironolactone 25mg  daily.

## 2014-03-20 NOTE — Assessment & Plan Note (Signed)
C/o persisted edema BLE and SOB, will continue Furosemide 40mg  and add Spironolactone 25mg . Daily. Update BMP one week.

## 2014-03-20 NOTE — Assessment & Plan Note (Signed)
Update BMP one week after adjusting diuretics.

## 2014-03-20 NOTE — Assessment & Plan Note (Signed)
Heart rate is in control, takes Atenolol 25mg daily, has pacemaker which is functional. Stopped Elliquis due to GI bleed.     

## 2014-03-20 NOTE — Assessment & Plan Note (Signed)
Cardiac and pulmonary etiologies: change DuoNeb to tid, adding Spironolactone for better symptomatic management of CHF, update BMP, adding Prednisone 10mg  for persisted cough/wheezes.

## 2014-03-20 NOTE — Assessment & Plan Note (Signed)
Takes Synthroid 125mcg daily.  12/28/13 TSH 2.01 02/05/14 TSH 2.68  

## 2014-03-20 NOTE — Assessment & Plan Note (Signed)
Comfort measures for her. Lorazepam and Oxycodone prn available to her.

## 2014-03-20 NOTE — Assessment & Plan Note (Signed)
Stable. Continue Omeprazole 20mg daily. 

## 2014-03-20 NOTE — Assessment & Plan Note (Signed)
03/19/14 CXR enlarged heart with moderate congestive heart failure, airspace opacities lower lungs bilaterally consistent with superimposed inflammatory process or pneumonia as well as atelectatic change.  03/20/14 O2 sat 97%, RR 20, T 97.1-will continue to observe the patient

## 2014-03-20 NOTE — Assessment & Plan Note (Signed)
Controlled. Takes Imdur and Atenolol.

## 2014-03-20 NOTE — Assessment & Plan Note (Signed)
Pain is reasonably managed with Norco 7.5/325mg  tid. Prn Oxycodone available to her.

## 2014-03-26 LAB — BASIC METABOLIC PANEL
BUN: 35 mg/dL — AB (ref 4–21)
Creatinine: 1.2 mg/dL — AB (ref 0.5–1.1)
Glucose: 91 mg/dL
Potassium: 4 mmol/L (ref 3.4–5.3)
SODIUM: 140 mmol/L (ref 137–147)

## 2014-03-27 ENCOUNTER — Encounter: Payer: Medicare Other | Admitting: Internal Medicine

## 2014-03-27 ENCOUNTER — Other Ambulatory Visit: Payer: Self-pay | Admitting: Nurse Practitioner

## 2014-03-27 DIAGNOSIS — N183 Chronic kidney disease, stage 3 unspecified: Secondary | ICD-10-CM

## 2014-03-29 ENCOUNTER — Encounter (HOSPITAL_COMMUNITY): Payer: Self-pay | Admitting: Internal Medicine

## 2014-03-30 ENCOUNTER — Encounter: Payer: Self-pay | Admitting: Nurse Practitioner

## 2014-03-30 ENCOUNTER — Non-Acute Institutional Stay (SKILLED_NURSING_FACILITY): Payer: Medicare Other | Admitting: Nurse Practitioner

## 2014-03-30 DIAGNOSIS — E039 Hypothyroidism, unspecified: Secondary | ICD-10-CM

## 2014-03-30 DIAGNOSIS — M76891 Other specified enthesopathies of right lower limb, excluding foot: Secondary | ICD-10-CM

## 2014-03-30 DIAGNOSIS — R062 Wheezing: Secondary | ICD-10-CM

## 2014-03-30 DIAGNOSIS — B370 Candidal stomatitis: Secondary | ICD-10-CM

## 2014-03-30 DIAGNOSIS — I482 Chronic atrial fibrillation, unspecified: Secondary | ICD-10-CM

## 2014-03-30 DIAGNOSIS — W19XXXA Unspecified fall, initial encounter: Secondary | ICD-10-CM | POA: Insufficient documentation

## 2014-03-30 DIAGNOSIS — I442 Atrioventricular block, complete: Secondary | ICD-10-CM

## 2014-03-30 DIAGNOSIS — W19XXXD Unspecified fall, subsequent encounter: Secondary | ICD-10-CM

## 2014-03-30 DIAGNOSIS — K274 Chronic or unspecified peptic ulcer, site unspecified, with hemorrhage: Secondary | ICD-10-CM

## 2014-03-30 DIAGNOSIS — Y92129 Unspecified place in nursing home as the place of occurrence of the external cause: Secondary | ICD-10-CM

## 2014-03-30 DIAGNOSIS — I5032 Chronic diastolic (congestive) heart failure: Secondary | ICD-10-CM

## 2014-03-30 DIAGNOSIS — K5900 Constipation, unspecified: Secondary | ICD-10-CM

## 2014-03-30 DIAGNOSIS — G4700 Insomnia, unspecified: Secondary | ICD-10-CM

## 2014-03-30 NOTE — Assessment & Plan Note (Signed)
Stable. Continue Omeprazole 20mg daily. 

## 2014-03-30 NOTE — Assessment & Plan Note (Signed)
MiraLax daily, controlled.  

## 2014-03-30 NOTE — Assessment & Plan Note (Signed)
Takes Synthroid 125mcg daily.  12/28/13 TSH 2.01 02/05/14 TSH 2.68  

## 2014-03-30 NOTE — Assessment & Plan Note (Signed)
03/29/14 fall w/o apparent injury. Multiple factorials: generalized weakness and possible AR of medications. Supervision.

## 2014-03-30 NOTE — Assessment & Plan Note (Signed)
Improving, complete Magic Mouth Wash.

## 2014-03-30 NOTE — Assessment & Plan Note (Signed)
Heart rate is in control, takes Atenolol 25mg daily, has pacemaker which is functional. Stopped Elliquis due to GI bleed.     

## 2014-03-30 NOTE — Assessment & Plan Note (Signed)
Pain is controlled with Norco 7.5/325mg  tid. Oxycodone 5mg  q4h prn available to her

## 2014-03-30 NOTE — Assessment & Plan Note (Signed)
Has pacemaker, no c/o angina since last visit, takes Isosorbide 30mg  daily.

## 2014-03-30 NOTE — Progress Notes (Signed)
Patient ID: Sabrina Bishop, female   DOB: 02/28/1917, 80 y.o.   MRN: [id number]   Code Status: DNR, MOST  Allergies  Allergen Reactions  . Vioxx [Rofecoxib] Other (See Comments)    Pt doesn't remember  . Adhesive [Tape] Rash    Chief Complaint  Patient presents with  . Medical Management of Chronic Issues  . Acute Visit    fall    HPI: Patient is a 80 y.o. female seen in the SNF at [facility] today for evaluation of fall and other chronic medical conditions.  Problem List Items Addressed This Visit    Wheezing    Cardiac and pulmonary etiologies: change DuoNeb to tid, adding Spironolactone for better symptomatic management of CHF, update BMP, adding Prednisone 10mg  for persisted cough/wheezes.  03/30/14 improved: continue Mucinex 600mg  bid, DuoNeb tid, Prednisone 10mg  daily    Oral candidiasis    Improving, complete Magic Mouth Wash.     Insomnia    Improved since Lorazepam 0.5mg  qhs 03/24/14. Lorazepam 0.5mg  q4h prn available to her for anxiety associated with SOB    Hypothyroidism - Primary (Chronic)    Takes Synthroid daily.  12/28/13 TSH 2.01 02/05/14 TSH 2.68      GI bleed    Stable. Continue Omeprazole 20mg  daily.      Fall at nursing home    03/29/14 fall w/o apparent injury. Multiple factorials: generalized weakness and possible AR of medications. Supervision.     Enthesopathy of hip region    Pain is controlled with Norco 7.5/325mg  tid. Oxycodone 5mg  q4h prn available to her      DIASTOLIC HEART FAILURE, CHRONIC (Chronic)    02/05/14 BNP 460.3 02/16/14 BNP 421.5 02/19/14 BNP 489.2 03/12/14 BNP 424.6 03/19/14 CXR enlarged heart with moderate congestive heart failure, airspace opacities lower lungs bilaterally consistent with superimposed inflammatory process or pneumonia as well as atelectatic change. Additional Furosemide 20mg  x 3 days. Then dc Kcl-adding Spironolactone 25mg  daily.  03/30/14 clinically stable, continue Furosemide 40mg  and  Spironolactone 25mg  daily     Constipation    MiraLax daily, controlled.       AV BLOCK, COMPLETE    Has pacemaker, no c/o angina since last visit, takes Isosorbide 30mg  daily.     Atrial fibrillation (Chronic)    Heart rate is in control, takes Atenolol 25mg  daily, has pacemaker which is functional. Stopped Elliquis due to GI bleed.         Review of Systems:  Review of Systems  Constitutional: Positive for malaise/fatigue. Negative for fever and chills.       Chronic  HENT:       C/o burning sensation of tongue-better   Eyes: Negative for blurred vision, double vision, photophobia, pain, discharge and redness.  Respiratory: Positive for cough, shortness of breath and wheezing. Negative for hemoptysis and sputum production.        Moist cough  Cardiovascular: Positive for orthopnea, leg swelling and PND. Negative for chest pain, palpitations and claudication.       Worsened edema BLE  Gastrointestinal: Positive for constipation. Negative for heartburn, nausea, vomiting, abdominal pain and diarrhea.  Genitourinary: Negative for dysuria, urgency, frequency, hematuria and flank pain.  Musculoskeletal: Positive for joint pain. Negative for back pain, falls and neck pain.  Skin: Negative for itching and rash.  Neurological: Negative for dizziness, tingling, tremors, sensory change, speech change, focal weakness, seizures, loss of consciousness and headaches.  Psychiatric/Behavioral: Positive for memory loss. Negative for depression. The patient is  not nervous/anxious and does not have insomnia.      Past Medical History  Diagnosis Date  . Hypothyroidism   . OA (osteoarthritis)   . HTN (hypertension)   . Diastolic dysfunction   . AV BLOCK, COMPLETE 03/26/2009    Qualifier: Diagnosis of  By: Graciela HusbandsKlein, MD, Ruthann CancerFACC, Ty HiltsSteven Cochran   . Pacemaker-mdt DDD 12/16/2010  . Keratoconus, unspecified   . Disturbance of salivary secretion   . GERD (gastroesophageal reflux disease)   .  Osteoarthrosis, unspecified whether generalized or localized, unspecified site   . Pain in joint, ankle and foot   . Spinal stenosis, lumbar region, without neurogenic claudication   . Lumbago   . Trigger finger (acquired)   . Abnormality of gait   . Edema   . Dysphagia, unspecified(787.20)   . Seborrheic keratosis 05/2012  . Other atopic dermatitis and related conditions 03/2012  . Unspecified diastolic heart failure 02/16/2012  . Mixed hyperlipidemia 08/2011  . Chronic kidney disease, stage II (mild) 08/2011  . Hypoxemia 08/2011  . Enthesopathy of hip region 01/2011  . Keratoconjunctivitis 2011  . Atrial fibrillation 02/15/2013  . Unspecified venous (peripheral) insufficiency 2010  . Pain in joint, shoulder region 2015    left  . Keratoacanthoma of hand 2015    left  . Actinic keratoses 2015    right arm and left cheek   Past Surgical History  Procedure Laterality Date  . Total knee arthroplasty  2005    left  . Abdominal hysterectomy  1950  . Pacemaker generator change  04/2012    Dr. Graciela HusbandsKlein  . Pacemaker placement  2004  . Total knee arthroplasty  1995    right  . Cataract extraction w/ intraocular lens implant  2012    right  . Esophagogastroduodenoscopy N/A 01/20/2014    Procedure: ESOPHAGOGASTRODUODENOSCOPY (EGD);  Surgeon: Shirley FriarVincent C. Schooler, MD;  [facility];  Service: Endoscopy;  Laterality: N/A;  . Permanent pacemaker generator change N/A 05/02/2012    Procedure: PERMANENT PACEMAKER GENERATOR CHANGE;  Surgeon: Duke SalviaSteven C Klein, MD;  [facility];  Service: Cardiovascular;  Laterality: N/A;   Social History:   reports that she has never smoked. She has never used smokeless tobacco. She reports that she does not drink alcohol or use illicit drugs.  Family History  Problem Relation Age of Onset  . Hyperlipidemia    . Hypertension    . Osteoporosis    . Stroke Father   . Congestive Heart Failure Brother   . Congestive Heart Failure Brother      Medications: Patient's Medications  New Prescriptions   No medications on file  Previous Medications   AMOXICILLIN (AMOXIL) 500 MG CAPSULE    Take 2,000 mg by mouth once as needed (prior to dental appointments).   ATENOLOL (TENORMIN) 50 MG TABLET    Take 0.5 tablets (25 mg total) by mouth daily.   ATORVASTATIN (LIPITOR) 10 MG TABLET       DIPHENHYD-HYDROCORT-NYSTATIN (FIRST-DUKES MOUTHWASH MT)    Use as directed in the mouth or throat. SWISH AND SPIT 5 ML BY MOUTH BEFORE MEALS AND AT BEDTIME FOR 21 DAY'S   FUROSEMIDE (LASIX) 40 MG TABLET    Take 40 mg by mouth. TAKE ONE TAB BY MOUTH EVERY DAY   GERIATRIC MULTIVITAMINS-MINERALS (ELDERTONIC/GEVRABON) ELIX    Take 15 mLs by mouth daily.   HYDROCODONE-ACETAMINOPHEN (NORCO) 7.5-325 MG PER TABLET    Take one tablet by mouth three times daily for pain   ISOSORBIDE  MONONITRATE (IMDUR) 30 MG 24 HR TABLET    Take 0.5 tablets (15 mg total) by mouth daily.   LEVOTHYROXINE (SYNTHROID, LEVOTHROID) 100 MCG TABLET       LEVOTHYROXINE (SYNTHROID, LEVOTHROID) 125 MCG TABLET    Take 1 tablet (125 mcg total) by mouth daily before breakfast.   LORAZEPAM (ATIVAN) 1 MG TABLET    Take one tablet by mouth every 4 hours as needed for anxiety or agitation   OMEPRAZOLE (PRILOSEC) 20 MG CAPSULE    Take 20 mg by mouth daily. TAKE 1 CAP BY MOUTH EVERY DAY TAKE 30-60 MINUTES BEFORE EATING   OXYCODONE (ROXICODONE INTENSOL) 20 MG/ML CONCENTRATED SOLUTION    Take 5 mg by mouth every 4 (four) hours as needed for severe pain.   PANTOPRAZOLE (PROTONIX) 40 MG TABLET    Take 1 tablet (40 mg total) by mouth daily.   POLYETHYLENE GLYCOL (MIRALAX / GLYCOLAX) PACKET    Take 17 g by mouth daily. FILL CAP TO 17 GM MARK, MIX WITH 4 OUNCES OF FLUID AND TAKE BY MOUTH EVERY DAY   SPIRONOLACTONE (ALDACTONE) 25 MG TABLET    Take 25 mg by mouth daily.   VITAMIN C (VITAMIN C) 500 MG TABLET    Take 1 tablet (500 mg total) by mouth daily.  Modified Medications   No medications on file   Discontinued Medications   No medications on file     Physical Exam: Physical Exam  Constitutional: She is oriented to person, place, and time. No distress.  frail  HENT:  Head: Normocephalic and atraumatic.  Partial deafness. Inflamed tongue no lesions noted-improved.   Eyes: Pupils are equal, round, and reactive to light.  Neck: Normal range of motion. Neck supple. No JVD present. No tracheal deviation present. No thyromegaly present.  Cardiovascular: Normal rate, regular rhythm, normal heart sounds and intact distal pulses.  Exam reveals no gallop and no friction rub.   No murmur heard. Pulmonary/Chest: Effort normal. No respiratory distress. She has wheezes. She has rales. She exhibits no tenderness.  Moist rales diffused-less. Decreased breath sounds R+L  Abdominal: Bowel sounds are normal. She exhibits no distension and no mass. There is no tenderness.  Musculoskeletal: Normal range of motion. She exhibits edema. She exhibits no tenderness.  Using power chair and cane. BLE edema 2+ LLE>RLE  Lymphadenopathy:    She has no cervical adenopathy.  Neurological: She is alert and oriented to person, place, and time. No cranial nerve deficit.  Skin: Skin is warm and dry. No rash noted. No erythema. No pallor.  Senile ecchymoses.    Psychiatric: She has a normal mood and affect. Her behavior is normal. Thought content normal. Cognition and memory are impaired. She exhibits abnormal recent memory.    Filed Vitals:   03/30/14 1210  BP: 150/72  Pulse: 64  Temp: 98 F (36.7 C)  TempSrc: Tympanic  Resp: 18      Labs reviewed: Basic Metabolic Panel:  Recent Labs  96/04/54  01/19/14 1755  01/21/14 0235 01/22/14 0035 01/22/14 2350  02/05/14  02/19/14 03/12/14 03/26/14  NA 136*  < >  --   < > 136* 136* 139  < > 142  < > 141 140 140  K 4.2  < >  --   < > 5.0 5.1 5.5*  < > 3.6  < > 3.7 4.1 4.0  CL  --   < >  --   < > 104 105 108  --   --   --   --   --   --  CO2  --   < >   --   < > 17* 17* 17*  --   --   --   --   --   --   GLUCOSE  --   < >  --   < > 82 93 126*  --   --   --   --   --   --   BUN 43*  < >  --   < > 43* 38* 36*  < > 25*  < > 23* 21 35*  CREATININE 1.6*  < >  --   < > 1.68* 1.62* 1.95*  < > 1.4*  < > 1.3* 1.2* 1.2*  CALCIUM  --   < >  --   < > 8.3* 8.5 8.5  --   --   --   --   --   --   TSH 2.01  --  10.510*  --   --   --   --   --  2.68  --   --   --   --   < > = values in this interval not displayed. Liver Function Tests:  Recent Labs  01/10/14 1541 01/20/14 0720 01/21/14 0235 01/29/14  AST 26 86* 45* 34  ALT 15 52* 36* 36*  ALKPHOS 54 89 73 87  BILITOT 0.8 0.6 0.5  --   PROT 6.7 6.3 5.5*  --   ALBUMIN 3.7 3.0* 2.7*  --    No results for input(s): LIPASE, AMYLASE in the last 8760 hours. No results for input(s): AMMONIA in the last 8760 hours. CBC:  Recent Labs  01/10/14 1541  01/21/14 0235 01/22/14 0035 01/22/14 2350 01/29/14  WBC 6.4  < > 7.3 6.6 6.5 10.7  NEUTROABS 4.6  --   --   --   --   --   HGB 10.0*  < > 10.7* 11.2* 11.4* 11.5*  HCT 29.9*  < > 32.2* 34.5* 35.7* 36  MCV 92.4  < > 87.0 88.2 92.5  --   PLT 185.0  < > 184 175 174 160  < > = values in this interval not displayed. Lipid Panel:  Recent Labs  05/29/13  CHOL 136  HDL 43  LDLCALC 74  TRIG 93    Past Procedures:  01/19/14 CXR   IMPRESSION:  1. Stable cardiomegaly. Pulmonary vascularity is normal. Cardiac  pacer noted with lead tips in the right atrium and right ventricle.  2. Mild bibasilar subsegmental axis with small bilateral pleural  effusions.  3. Stable lumbar compression fractures. Diffuse thoracolumbar spine  osteopenia and degenerative change.    Assessment/Plan Hypothyroidism Takes Synthroid 125mcg daily.  12/28/13 TSH 2.01 02/05/14 TSH 2.68    GI bleed Stable. Continue Omeprazole 20mg  daily.    Atrial fibrillation Heart rate is in control, takes Atenolol 25mg  daily, has pacemaker which is functional. Stopped Elliquis due to  GI bleed.    DIASTOLIC HEART FAILURE, CHRONIC 02/05/14 BNP 460.3 02/16/14 BNP 421.5 02/19/14 BNP 489.2 03/12/14 BNP 424.6 03/19/14 CXR enlarged heart with moderate congestive heart failure, airspace opacities lower lungs bilaterally consistent with superimposed inflammatory process or pneumonia as well as atelectatic change. Additional Furosemide 20mg  x 3 days. Then dc Kcl-adding Spironolactone 25mg  daily.  03/30/14 clinically stable, continue Furosemide 40mg  and Spironolactone 25mg  daily   AV BLOCK, COMPLETE Has pacemaker, no c/o angina since last visit, takes Isosorbide 30mg  daily.   Constipation MiraLax daily, controlled.     Wheezing  Cardiac and pulmonary etiologies: change DuoNeb to tid, adding Spironolactone for better symptomatic management of CHF, update BMP, adding Prednisone 10mg  for persisted cough/wheezes.  03/30/14 improved: continue Mucinex 600mg  bid, DuoNeb tid, Prednisone 10mg  daily  Oral candidiasis Improving, complete Magic Mouth Wash.   Enthesopathy of hip region Pain is controlled with Norco 7.5/325mg  tid. Oxycodone 5mg  q4h prn available to her    Insomnia Improved since Lorazepam 0.5mg  qhs 03/24/14. Lorazepam 0.5mg  q4h prn available to her for anxiety associated with SOB  Fall at nursing home 03/29/14 fall w/o apparent injury. Multiple factorials: generalized weakness and possible AR of medications. Supervision.     Family/ Staff Communication: observe the patient. Palliative care.   Goals of Care: SNF  Labs/tests ordered: none

## 2014-03-30 NOTE — Assessment & Plan Note (Signed)
Improved since Lorazepam 0.5mg  qhs 03/24/14. Lorazepam 0.5mg  q4h prn available to her for anxiety associated with SOB

## 2014-03-30 NOTE — Assessment & Plan Note (Signed)
Cardiac and pulmonary etiologies: change DuoNeb to tid, adding Spironolactone for better symptomatic management of CHF, update BMP, adding Prednisone 10mg  for persisted cough/wheezes.  03/30/14 improved: continue Mucinex 600mg  bid, DuoNeb tid, Prednisone 10mg  daily

## 2014-03-30 NOTE — Assessment & Plan Note (Signed)
02/05/14 BNP 460.3 02/16/14 BNP 421.5 02/19/14 BNP 489.2 03/12/14 BNP 424.6 03/19/14 CXR enlarged heart with moderate congestive heart failure, airspace opacities lower lungs bilaterally consistent with superimposed inflammatory process or pneumonia as well as atelectatic change. Additional Furosemide 20mg  x 3 days. Then dc Kcl-adding Spironolactone 25mg  daily.  03/30/14 clinically stable, continue Furosemide 40mg  and Spironolactone 25mg  daily

## 2014-04-16 ENCOUNTER — Telehealth: Payer: Self-pay | Admitting: Cardiology

## 2014-04-16 ENCOUNTER — Encounter: Payer: Medicare Other | Admitting: *Deleted

## 2014-04-16 NOTE — Telephone Encounter (Signed)
Attempted to confirm remote transmission with pt. No answer and was unable to leave a message.   

## 2014-04-18 ENCOUNTER — Encounter: Payer: Self-pay | Admitting: Cardiology

## 2014-04-24 ENCOUNTER — Ambulatory Visit (INDEPENDENT_AMBULATORY_CARE_PROVIDER_SITE_OTHER): Payer: Medicare Other | Admitting: *Deleted

## 2014-04-24 ENCOUNTER — Telehealth: Payer: Self-pay | Admitting: Internal Medicine

## 2014-04-24 DIAGNOSIS — I442 Atrioventricular block, complete: Secondary | ICD-10-CM

## 2014-04-24 LAB — MDC_IDC_ENUM_SESS_TYPE_REMOTE
Brady Statistic AP VP Percent: 48 %
Brady Statistic AP VS Percent: 0 %
Brady Statistic AS VP Percent: 52 %
Date Time Interrogation Session: 20160105144554
Lead Channel Pacing Threshold Amplitude: 0.625 V
Lead Channel Pacing Threshold Pulse Width: 0.4 ms
Lead Channel Setting Pacing Amplitude: 2 V
Lead Channel Setting Sensing Sensitivity: 2 mV
MDC IDC MSMT BATTERY IMPEDANCE: 177 Ohm
MDC IDC MSMT BATTERY REMAINING LONGEVITY: 100 mo
MDC IDC MSMT BATTERY VOLTAGE: 2.78 V
MDC IDC MSMT LEADCHNL RA IMPEDANCE VALUE: 512 Ohm
MDC IDC MSMT LEADCHNL RA SENSING INTR AMPL: 0.7 mV
MDC IDC MSMT LEADCHNL RV IMPEDANCE VALUE: 464 Ohm
MDC IDC SET LEADCHNL RV PACING AMPLITUDE: 2.5 V
MDC IDC SET LEADCHNL RV PACING PULSEWIDTH: 0.4 ms
MDC IDC STAT BRADY AS VS PERCENT: 0 %

## 2014-04-24 NOTE — Progress Notes (Signed)
Remote pacemaker transmission.   

## 2014-04-24 NOTE — Telephone Encounter (Signed)
Informed nurse at [facility] the transmission was received.

## 2014-04-24 NOTE — Telephone Encounter (Signed)
New msg       Pt missed pacer check on 04/17/15 and was informed of one for today.   Pt states she would like to speak with someone about pacer checks because she is in a facility now.  Please contact at [phone].

## 2014-04-27 ENCOUNTER — Non-Acute Institutional Stay (SKILLED_NURSING_FACILITY): Payer: Medicare Other | Admitting: Nurse Practitioner

## 2014-04-27 ENCOUNTER — Encounter: Payer: Self-pay | Admitting: Nurse Practitioner

## 2014-04-27 DIAGNOSIS — N183 Chronic kidney disease, stage 3 unspecified: Secondary | ICD-10-CM

## 2014-04-27 DIAGNOSIS — I10 Essential (primary) hypertension: Secondary | ICD-10-CM

## 2014-04-27 DIAGNOSIS — R609 Edema, unspecified: Secondary | ICD-10-CM

## 2014-04-27 DIAGNOSIS — L03116 Cellulitis of left lower limb: Secondary | ICD-10-CM

## 2014-04-27 DIAGNOSIS — F4323 Adjustment disorder with mixed anxiety and depressed mood: Secondary | ICD-10-CM | POA: Insufficient documentation

## 2014-04-27 DIAGNOSIS — E039 Hypothyroidism, unspecified: Secondary | ICD-10-CM

## 2014-04-27 DIAGNOSIS — I5032 Chronic diastolic (congestive) heart failure: Secondary | ICD-10-CM

## 2014-04-27 DIAGNOSIS — K5900 Constipation, unspecified: Secondary | ICD-10-CM

## 2014-04-27 DIAGNOSIS — I480 Paroxysmal atrial fibrillation: Secondary | ICD-10-CM

## 2014-04-27 NOTE — Assessment & Plan Note (Signed)
Takes Synthroid 125mcg daily.  12/28/13 TSH 2.01 02/05/14 TSH 2.68  

## 2014-04-27 NOTE — Assessment & Plan Note (Signed)
Overall less edema BLE except LLE cellulitis

## 2014-04-27 NOTE — Assessment & Plan Note (Addendum)
LLE warmth, swelling, and tenderness-04/26/14 venous US LLE negative fro deep venous thrombosis at major veins of left lower extremity. Doxycycline 100mg  bid x 14 days. Triamcinolone cream nightly LLE. Observe.

## 2014-04-27 NOTE — Assessment & Plan Note (Signed)
MiraLax daily, controlled.  

## 2014-04-27 NOTE — Assessment & Plan Note (Signed)
Heart rate is in control, takes Atenolol 25mg daily, has pacemaker which is functional. Stopped Elliquis due to GI bleed.     

## 2014-04-27 NOTE — Assessment & Plan Note (Signed)
02/19/14 Bun/creat 23/1.27 03/12/14 Bun/creat 21/1.22 03/26/14 Bun/creat 35/1.21 04/27/14 update BMP/BNP

## 2014-04-27 NOTE — Progress Notes (Signed)
Patient ID: Sabrina Bishop, female   DOB: [birth year], 82 y.o.   MRN: [id number]   Code Status: DNR, MOST  Allergies  Allergen Reactions  . Vioxx [Rofecoxib] Other (See Comments)    Pt doesn't remember  . Adhesive [Tape] Rash    Chief Complaint  Patient presents with  . Medical Management of Chronic Issues  . Acute Visit    LLE cellulitis.     HPI: Patient is a 81 y.o. female seen in the SNF at [facility] today for evaluation of LLE cellulitis and other chronic medical conditions.  Problem List Items Addressed This Visit    Hypothyroidism (Chronic)    Takes Synthroid daily.  12/28/13 TSH 2.01 02/05/14 TSH 2.68      Essential hypertension (Chronic)    Controlled. Takes Imdur and Atenolol.       Edema (Chronic)    Overall less edema BLE except LLE cellulitis    DIASTOLIC HEART FAILURE, CHRONIC (Chronic)    02/05/14 BNP 460.3 02/16/14 BNP 421.5 02/19/14 BNP 489.2 03/12/14 BNP 424.6 03/19/14 CXR enlarged heart with moderate congestive heart failure, airspace opacities lower lungs bilaterally consistent with superimposed inflammatory process or pneumonia as well as atelectatic change. Additional Furosemide  x 3 days. Then dc Kcl-adding Spironolactone  daily.  03/30/14 clinically stable, continue Furosemide  and Spironolactone  daily 04/27/14 BMP/BNP. Clinically compensated.      Constipation    MiraLax daily, controlled.       Chronic kidney disease, stage III (moderate)    02/19/14 Bun/creat 23/1.27 03/12/14 Bun/creat 21/1.22 03/26/14 Bun/creat 35/1.21 04/27/14 update BMP/BNP     Cellulitis of leg, left - Primary    LLE warmth, swelling, and tenderness-04/26/14 venous US LLE negative fro deep venous thrombosis at major veins of left lower extremity. Doxycycline  bid x 14 days. Triamcinolone cream nightly LLE. Observe.     Atrial fibrillation (Chronic)    Heart rate is in control, takes Atenolol  daily, has pacemaker which is  functional. Stopped Elliquis due to GI bleed.          Review of Systems:  Review of Systems  Constitutional: Negative for fever, chills and malaise/fatigue.       Chronic  HENT:       C/o burning sensation of tongue-better   Eyes: Negative for blurred vision, double vision, photophobia, pain, discharge and redness.  Respiratory: Positive for cough, shortness of breath and wheezing. Negative for hemoptysis and sputum production.        Moist cough  Cardiovascular: Positive for orthopnea, leg swelling and PND. Negative for chest pain, palpitations and claudication.       Worsened edema BLE  Gastrointestinal: Positive for constipation. Negative for heartburn, nausea, vomiting, abdominal pain and diarrhea.  Genitourinary: Negative for dysuria, urgency, frequency, hematuria and flank pain.  Musculoskeletal: Positive for joint pain. Negative for back pain, falls and neck pain.  Skin: Negative for itching and rash.       Pigmented and scaly BLE. Erythema/heat/swelling LLE mainly in foot. RLE edema trace.   Neurological: Negative for dizziness, tingling, tremors, sensory change, speech change, focal weakness, seizures, loss of consciousness and headaches.  Psychiatric/Behavioral: Positive for memory loss. Negative for depression. The patient is not nervous/anxious and does not have insomnia.      Past Medical History  Diagnosis Date  . Hypothyroidism   . OA (osteoarthritis)   . HTN (hypertension)   . Diastolic dysfunction   . AV BLOCK, COMPLETE 03/26/2009  Qualifier: Diagnosis of  By: Graciela Husbands, MD, Susie Cassette   . Pacemaker-mdt DDD 12/16/2010  . Keratoconus, unspecified   . Disturbance of salivary secretion   . GERD (gastroesophageal reflux disease)   . Osteoarthrosis, unspecified whether generalized or localized, unspecified site   . Pain in joint, ankle and foot   . Spinal stenosis, lumbar region, without neurogenic claudication   . Lumbago   . Trigger finger (acquired)    . Abnormality of gait   . Edema   . Dysphagia, unspecified(787.20)   . Seborrheic keratosis 05/2012  . Other atopic dermatitis and related conditions 03/2012  . Unspecified diastolic heart failure 02/16/2012  . Mixed hyperlipidemia 08/2011  . Chronic kidney disease, stage II (mild) 08/2011  . Hypoxemia 08/2011  . Enthesopathy of hip region 01/2011  . Keratoconjunctivitis 2011  . Atrial fibrillation 02/15/2013  . Unspecified venous (peripheral) insufficiency 2010  . Pain in joint, shoulder region 2015    left  . Keratoacanthoma of hand 2015    left  . Actinic keratoses 2015    right arm and left cheek   Past Surgical History  Procedure Laterality Date  . Total knee arthroplasty  2005    left  . Abdominal hysterectomy  1950  . Pacemaker generator change  04/2012    Dr. Graciela Husbands  . Pacemaker placement  2004  . Total knee arthroplasty  1995    right  . Cataract extraction w/ intraocular lens implant  2012    right  . Esophagogastroduodenoscopy N/A 01/20/2014    Procedure: ESOPHAGOGASTRODUODENOSCOPY (EGD);  Surgeon: Shirley Friar, MD;  Location: [facility] ENDOSCOPY;  Service: Endoscopy;  Laterality: N/A;  . Permanent pacemaker generator change N/A 05/02/2012    Procedure: PERMANENT PACEMAKER GENERATOR CHANGE;  Surgeon: Duke Salvia, MD;  Location: [facility] CATH LAB;  Service: Cardiovascular;  Laterality: N/A;   Social History:   reports that she has never smoked. She has never used smokeless tobacco. She reports that she does not drink alcohol or use illicit drugs.  Family History  Problem Relation Age of Onset  . Hyperlipidemia    . Hypertension    . Osteoporosis    . Stroke Father   . Congestive Heart Failure Brother   . Congestive Heart Failure Brother     Medications: Patient's Medications  New Prescriptions   No medications on file  Previous Medications   AMOXICILLIN (AMOXIL) 500 MG CAPSULE    Take 2,000 mg by mouth once as needed (prior to dental appointments).   ATENOLOL  (TENORMIN) 50 MG TABLET    Take 0.5 tablets (25 mg total) by mouth daily.   ATORVASTATIN (LIPITOR) 10 MG TABLET       DIPHENHYD-HYDROCORT-NYSTATIN (FIRST-DUKES MOUTHWASH MT)    Use as directed in the mouth or throat. SWISH AND SPIT 5 ML BY MOUTH BEFORE MEALS AND AT BEDTIME FOR 21 DAY'S   FUROSEMIDE (LASIX) 40 MG TABLET    Take 40 mg by mouth. TAKE ONE TAB BY MOUTH EVERY DAY   GERIATRIC MULTIVITAMINS-MINERALS (ELDERTONIC/GEVRABON) ELIX    Take 15 mLs by mouth daily.   HYDROCODONE-ACETAMINOPHEN (NORCO) 7.5-325 MG PER TABLET    Take one tablet by mouth three times daily for pain   ISOSORBIDE MONONITRATE (IMDUR) 30 MG 24 HR TABLET    Take 0.5 tablets (15 mg total) by mouth daily.   LEVOTHYROXINE (SYNTHROID, LEVOTHROID) 100 MCG TABLET       LEVOTHYROXINE (SYNTHROID, LEVOTHROID) 125 MCG TABLET    Take 1 tablet (125  mcg total) by mouth daily before breakfast.   LORAZEPAM (ATIVAN) 1 MG TABLET    Take one tablet by mouth every 4 hours as needed for anxiety or agitation   OMEPRAZOLE (PRILOSEC) 20 MG CAPSULE    Take 20 mg by mouth daily. TAKE 1 CAP BY MOUTH EVERY DAY TAKE 30-60 MINUTES BEFORE EATING   OXYCODONE (ROXICODONE INTENSOL) 20 MG/ML CONCENTRATED SOLUTION    Take 5 mg by mouth every 4 (four) hours as needed for severe pain.   PANTOPRAZOLE (PROTONIX) 40 MG TABLET    Take 1 tablet (40 mg total) by mouth daily.   POLYETHYLENE GLYCOL (MIRALAX / GLYCOLAX) PACKET    Take 17 g by mouth daily. FILL CAP TO 17 GM MARK, MIX WITH 4 OUNCES OF FLUID AND TAKE BY MOUTH EVERY DAY   SPIRONOLACTONE (ALDACTONE) 25 MG TABLET    Take 25 mg by mouth daily.   VITAMIN C (VITAMIN C) 500 MG TABLET    Take 1 tablet (500 mg total) by mouth daily.  Modified Medications   No medications on file  Discontinued Medications   No medications on file     Physical Exam: Physical Exam  Constitutional: She is oriented to person, place, and time. No distress.  frail  HENT:  Head: Normocephalic and atraumatic.  Partial deafness.  Inflamed tongue no lesions noted-improved.   Eyes: Pupils are equal, round, and reactive to light.  Neck: Normal range of motion. Neck supple. No JVD present. No tracheal deviation present. No thyromegaly present.  Cardiovascular: Normal rate, regular rhythm, normal heart sounds and intact distal pulses.  Exam reveals no gallop and no friction rub.   No murmur heard. Pulmonary/Chest: Effort normal. No respiratory distress. She has no wheezes. She has rales. She exhibits no tenderness.  Moist rales diffused-less. Decreased breath sounds R+L  Abdominal: Bowel sounds are normal. She exhibits no distension and no mass. There is no tenderness.  Musculoskeletal: Normal range of motion. She exhibits edema. She exhibits no tenderness.  Using power chair and cane. BLE edema 2+ LLE>RLE trace  Lymphadenopathy:    She has no cervical adenopathy.  Neurological: She is alert and oriented to person, place, and time. No cranial nerve deficit.  Skin: Skin is warm and dry. No rash noted. No erythema. No pallor.  Pigmented and scaly BLE. Erythema/heat/swelling LLE mainly in foot. RLE edema trace.     Psychiatric: She has a normal mood and affect. Her behavior is normal. Thought content normal. Cognition and memory are impaired. She exhibits abnormal recent memory.    Filed Vitals:   04/27/14 1031  BP: 158/90  Pulse: 60  Temp: 97.1 F (36.2 C)  TempSrc: Tympanic  Resp: 20  SpO2: 98%      Labs reviewed: Basic Metabolic Panel:  Recent Labs  16/10/96  01/19/14 1755  01/21/14 0235 01/22/14 0035 01/22/14 2350  02/05/14  02/19/14 03/12/14 03/26/14  NA 136*  < >  --   < > 136* 136* 139  < > 142  < > 141 140 140  K 4.2  < >  --   < > 5.0 5.1 5.5*  < > 3.6  < > 3.7 4.1 4.0  CL  --   < >  --   < > 104 105 108  --   --   --   --   --   --   CO2  --   < >  --   < > 17* 17* 17*  --   --   --   --   --   --  GLUCOSE  --   < >  --   < > 82 93 126*  --   --   --   --   --   --   BUN 43*  < >  --   < >  43* 38* 36*  < > 25*  < > 23* 21 35*  CREATININE 1.6*  < >  --   < > 1.68* 1.62* 1.95*  < > 1.4*  < > 1.3* 1.2* 1.2*  CALCIUM  --   < >  --   < > 8.3* 8.5 8.5  --   --   --   --   --   --   TSH 2.01  --  10.510*  --   --   --   --   --  2.68  --   --   --   --   < > = values in this interval not displayed. Liver Function Tests:  Recent Labs  01/10/14 1541 01/20/14 0720 01/21/14 0235 01/29/14  AST 26 86* 45* 34  ALT 15 52* 36* 36*  ALKPHOS 54 89 73 87  BILITOT 0.8 0.6 0.5  --   PROT 6.7 6.3 5.5*  --   ALBUMIN 3.7 3.0* 2.7*  --    No results for input(s): LIPASE, AMYLASE in the last 8760 hours. No results for input(s): AMMONIA in the last 8760 hours. CBC:  Recent Labs  01/10/14 1541  01/21/14 0235 01/22/14 0035 01/22/14 2350 01/29/14  WBC 6.4  < > 7.3 6.6 6.5 10.7  NEUTROABS 4.6  --   --   --   --   --   HGB 10.0*  < > 10.7* 11.2* 11.4* 11.5*  HCT 29.9*  < > 32.2* 34.5* 35.7* 36  MCV 92.4  < > 87.0 88.2 92.5  --   PLT 185.0  < > 184 175 174 160  < > = values in this interval not displayed. Lipid Panel:  Recent Labs  05/29/13  CHOL 136  HDL 43  LDLCALC 74  TRIG 93    Past Procedures:  01/19/14 CXR   IMPRESSION:  1. Stable cardiomegaly. Pulmonary vascularity is normal. Cardiac  pacer noted with lead tips in the right atrium and right ventricle.  2. Mild bibasilar subsegmental axis with small bilateral pleural  effusions.  3. Stable lumbar compression fractures. Diffuse thoracolumbar spine  osteopenia and degenerative change.    Assessment/Plan Cellulitis of leg, left LLE warmth, swelling, and tenderness-04/26/14 venous US LLE negative fro deep venous thrombosis at major veins of left lower extremity. Doxycycline 100mg  bid x 14 days. Triamcinolone cream nightly LLE. Observe.   Atrial fibrillation Heart rate is in control, takes Atenolol 25mg  daily, has pacemaker which is functional. Stopped Elliquis due to GI bleed.     Chronic kidney disease, stage III  (moderate) 02/19/14 Bun/creat 23/1.27 03/12/14 Bun/creat 21/1.22 03/26/14 Bun/creat 35/1.21 04/27/14 update BMP/BNP   Constipation MiraLax daily, controlled.     DIASTOLIC HEART FAILURE, CHRONIC 02/05/14 BNP 460.3 02/16/14 BNP 421.5 02/19/14 BNP 489.2 03/12/14 BNP 424.6 03/19/14 CXR enlarged heart with moderate congestive heart failure, airspace opacities lower lungs bilaterally consistent with superimposed inflammatory process or pneumonia as well as atelectatic change. Additional Furosemide 20mg  x 3 days. Then dc Kcl-adding Spironolactone 25mg  daily.  03/30/14 clinically stable, continue Furosemide 40mg  and Spironolactone 25mg  daily 04/27/14 BMP/BNP. Clinically compensated.    Edema Overall less edema BLE except LLE cellulitis  Essential hypertension Controlled. Takes Imdur and  Atenolol.     Hypothyroidism Takes Synthroid daily.  12/28/13 TSH 2.01 02/05/14 TSH 2.68      Family/ Staff Communication: observe the patient. Palliative care.   Goals of Care: SNF  Labs/tests ordered: BMP/BNP

## 2014-04-27 NOTE — Assessment & Plan Note (Signed)
02/05/14 BNP 460.3 02/16/14 BNP 421.5 02/19/14 BNP 489.2 03/12/14 BNP 424.6 03/19/14 CXR enlarged heart with moderate congestive heart failure, airspace opacities lower lungs bilaterally consistent with superimposed inflammatory process or pneumonia as well as atelectatic change. Additional Furosemide 20mg  x 3 days. Then dc Kcl-adding Spironolactone 25mg  daily.  03/30/14 clinically stable, continue Furosemide 40mg  and Spironolactone 25mg  daily 04/27/14 BMP/BNP. Clinically compensated.

## 2014-04-27 NOTE — Assessment & Plan Note (Signed)
Controlled. Takes Imdur and Atenolol.   

## 2014-04-30 ENCOUNTER — Other Ambulatory Visit: Payer: Self-pay

## 2014-04-30 LAB — BASIC METABOLIC PANEL
BUN: 54 mg/dL — AB (ref 4–21)
Creatinine: 1.2 mg/dL — AB (ref 0.5–1.1)
Glucose: 95 mg/dL
Potassium: 4.2 mmol/L (ref 3.4–5.3)
Sodium: 136 mmol/L — AB (ref 137–147)

## 2014-04-30 MED ORDER — HYDROCODONE-ACETAMINOPHEN 7.5-325 MG PO TABS: ORAL_TABLET | ORAL | Status: AC

## 2014-04-30 NOTE — Telephone Encounter (Signed)
RX sent to [facility] pharmacy @ [phone], phone number [phone]. This is a nursing home refill request.   

## 2014-05-01 ENCOUNTER — Other Ambulatory Visit: Payer: Self-pay | Admitting: Nurse Practitioner

## 2014-05-01 DIAGNOSIS — N183 Chronic kidney disease, stage 3 unspecified: Secondary | ICD-10-CM

## 2014-05-01 DIAGNOSIS — I5032 Chronic diastolic (congestive) heart failure: Secondary | ICD-10-CM

## 2014-05-08 ENCOUNTER — Encounter: Payer: Self-pay | Admitting: Nurse Practitioner

## 2014-05-08 ENCOUNTER — Non-Acute Institutional Stay (SKILLED_NURSING_FACILITY): Payer: Medicare Other | Admitting: Nurse Practitioner

## 2014-05-08 DIAGNOSIS — N183 Chronic kidney disease, stage 3 unspecified: Secondary | ICD-10-CM

## 2014-05-08 DIAGNOSIS — R5383 Other fatigue: Secondary | ICD-10-CM

## 2014-05-08 DIAGNOSIS — R609 Edema, unspecified: Secondary | ICD-10-CM

## 2014-05-08 DIAGNOSIS — E039 Hypothyroidism, unspecified: Secondary | ICD-10-CM

## 2014-05-08 DIAGNOSIS — I5032 Chronic diastolic (congestive) heart failure: Secondary | ICD-10-CM

## 2014-05-08 DIAGNOSIS — I482 Chronic atrial fibrillation, unspecified: Secondary | ICD-10-CM

## 2014-05-08 DIAGNOSIS — F4323 Adjustment disorder with mixed anxiety and depressed mood: Secondary | ICD-10-CM

## 2014-05-08 DIAGNOSIS — R059 Cough, unspecified: Secondary | ICD-10-CM | POA: Insufficient documentation

## 2014-05-08 DIAGNOSIS — I10 Essential (primary) hypertension: Secondary | ICD-10-CM

## 2014-05-08 DIAGNOSIS — G4700 Insomnia, unspecified: Secondary | ICD-10-CM

## 2014-05-08 DIAGNOSIS — R05 Cough: Secondary | ICD-10-CM

## 2014-05-08 DIAGNOSIS — R5381 Other malaise: Secondary | ICD-10-CM

## 2014-05-08 DIAGNOSIS — L03116 Cellulitis of left lower limb: Secondary | ICD-10-CM

## 2014-05-08 DIAGNOSIS — K5900 Constipation, unspecified: Secondary | ICD-10-CM

## 2014-05-08 NOTE — Assessment & Plan Note (Signed)
LLE warmth, swelling, and tenderness-04/26/14 venous US LLE negative fro deep venous thrombosis at major veins of left lower extremity. Doxycycline 100mg  bid x 14 days. Triamcinolone cream nightly LLE. Observe.  05/08/14 improved.

## 2014-05-08 NOTE — Assessment & Plan Note (Signed)
02/19/14 Bun/creat 23/1.27 03/12/14 Bun/creat 21/1.22 03/26/14 Bun/creat 35/1.21 05/08/14 update CMP/BNP

## 2014-05-08 NOTE — Assessment & Plan Note (Signed)
02/05/14 BNP 460.3 02/16/14 BNP 421.5 02/19/14 BNP 489.2 03/12/14 BNP 424.6 03/19/14 CXR enlarged heart with moderate congestive heart failure, airspace opacities lower lungs bilaterally consistent with superimposed inflammatory process or pneumonia as well as atelectatic change. Additional Furosemide 20mg  x 3 days. Then dc Kcl-adding Spironolactone 25mg  daily.  03/30/14 clinically stable, continue Furosemide 40mg  and Spironolactone 25mg  daily 05/08/14 CMP/BNP. Clinically compensated.

## 2014-05-08 NOTE — Assessment & Plan Note (Signed)
Afebrile, no O2 desaturation, will obtain CXR

## 2014-05-08 NOTE — Assessment & Plan Note (Signed)
Will obtain CBC, CMP, BNP, UA C/S-observe the patient.

## 2014-05-08 NOTE — Progress Notes (Signed)
Patient ID: Sabrina PickleCatherine H Bishop, female   DOB: 09-24-1916, 81 y.o.   MRN: [id number]   Code Status: DNR, MOST  Allergies  Allergen Reactions  . Vioxx [Rofecoxib] Other (See Comments)    Pt doesn't remember  . Adhesive [Tape] Rash    Chief Complaint  Patient presents with  . Medical Management of Chronic Issues  . Acute Visit    cough, fatigue    HPI: Patient is a 81 y.o. female [facility] for evaluation of cough, malaise, and other chronic medical conditions.  Problem List Items Addressed This Visit    Malaise and fatigue - Primary    Will obtain CBC, CMP, BNP, UA C/S-observe the patient.       Insomnia    Improved since Lorazepam 0.5mg  qhs 03/24/14 and Lexapro 5mg  daily.  Lorazepam 0.5mg  q4h prn available to her for anxiety associated with SOB       Hypothyroidism (Chronic)    Takes Synthroid 125mcg daily.  12/28/13 TSH 2.01 02/05/14 TSH 2.68         Essential hypertension (Chronic)    Controlled. Takes Imdur and Atenolol.        Edema (Chronic)    Overall less edema        DIASTOLIC HEART FAILURE, CHRONIC (Chronic)    02/05/14 BNP 460.3 02/16/14 BNP 421.5 02/19/14 BNP 489.2 03/12/14 BNP 424.6 03/19/14 CXR enlarged heart with moderate congestive heart failure, airspace opacities lower lungs bilaterally consistent with superimposed inflammatory process or pneumonia as well as atelectatic change. Additional Furosemide 20mg  x 3 days. Then dc Kcl-adding Spironolactone 25mg  daily.  03/30/14 clinically stable, continue Furosemide 40mg  and Spironolactone 25mg  daily 05/08/14 CMP/BNP. Clinically compensated.        Cough    Afebrile, no O2 desaturation, will obtain CXR      Constipation    MiraLax daily, controlled.        Chronic kidney disease, stage III (moderate)    02/19/14 Bun/creat 23/1.27 03/12/14 Bun/creat 21/1.22 03/26/14 Bun/creat 35/1.21 05/08/14 update CMP/BNP        Cellulitis of leg, left    LLE warmth,  swelling, and tenderness-04/26/14 venous US LLE negative fro deep venous thrombosis at major veins of left lower extremity. Doxycycline 100mg  bid x 14 days. Triamcinolone cream nightly LLE. Observe.  05/08/14 improved.        Atrial fibrillation (Chronic)    Heart rate is in control, takes Atenolol 25mg  daily, has pacemaker which is functional. Stopped Elliquis due to GI bleed.        Adjustment disorder with mixed anxiety and depressed mood    04/27/14: no longer electric scooter 2nd to unsafe operation-son is in SNF due to health problem-she stated she would rather die-Lexapro 5mg  daily.  05/08/14 seems better         Review of Systems:  Review of Systems  Constitutional: Positive for malaise/fatigue. Negative for fever, chills, weight loss and diaphoresis.  HENT: Positive for hearing loss. Negative for congestion, ear discharge, ear pain, nosebleeds, sore throat and tinnitus.   Eyes: Negative for blurred vision, double vision, photophobia, pain, discharge and redness.  Respiratory: Positive for cough and shortness of breath. Negative for hemoptysis, sputum production, wheezing and stridor.   Cardiovascular: Positive for leg swelling and PND. Negative for chest pain, palpitations, orthopnea and claudication.  Gastrointestinal: Negative for heartburn, nausea, vomiting, abdominal pain, diarrhea, constipation, blood in stool and melena.  Genitourinary: Positive for frequency. Negative for dysuria, urgency, hematuria  and flank pain.  Musculoskeletal: Positive for back pain and joint pain. Negative for myalgias, falls and neck pain.  Skin: Negative for itching and rash.       BLE chronic venous dermatitis and recent treated cellulitis improved.   Neurological: Negative for dizziness, tingling, tremors, sensory change, speech change, focal weakness, seizures, loss of consciousness, weakness and headaches.  Endo/Heme/Allergies: Negative for environmental allergies and polydipsia. Does not  bruise/bleed easily.  Psychiatric/Behavioral: Positive for depression and memory loss. Negative for suicidal ideas, hallucinations and substance abuse. The patient is nervous/anxious and has insomnia.      Past Medical History  Diagnosis Date  . Hypothyroidism   . OA (osteoarthritis)   . HTN (hypertension)   . Diastolic dysfunction   . AV BLOCK, COMPLETE 03/26/2009    Qualifier: Diagnosis of  By: Graciela Husbands, MD, Ruthann Cancer Ty Hilts   . Pacemaker-mdt DDD 12/16/2010  . Keratoconus, unspecified   . Disturbance of salivary secretion   . GERD (gastroesophageal reflux disease)   . Osteoarthrosis, unspecified whether generalized or localized, unspecified site   . Pain in joint, ankle and foot   . Spinal stenosis, lumbar region, without neurogenic claudication   . Lumbago   . Trigger finger (acquired)   . Abnormality of gait   . Edema   . Dysphagia, unspecified(787.20)   . Seborrheic keratosis 05/2012  . Other atopic dermatitis and related conditions 03/2012  . Unspecified diastolic heart failure 02/16/2012  . Mixed hyperlipidemia 08/2011  . Chronic kidney disease, stage II (mild) 08/2011  . Hypoxemia 08/2011  . Enthesopathy of hip region 01/2011  . Keratoconjunctivitis 2011  . Atrial fibrillation 02/15/2013  . Unspecified venous (peripheral) insufficiency 2010  . Pain in joint, shoulder region 2015    left  . Keratoacanthoma of hand 2015    left  . Actinic keratoses 2015    right arm and left cheek   Past Surgical History  Procedure Laterality Date  . Total knee arthroplasty  2005    left  . Abdominal hysterectomy  1950  . Pacemaker generator change  04/2012    Dr. Graciela Husbands  . Pacemaker placement  2004  . Total knee arthroplasty  1995    right  . Cataract extraction w/ intraocular lens implant  2012    right  . Esophagogastroduodenoscopy N/A 01/20/2014    Procedure: ESOPHAGOGASTRODUODENOSCOPY (EGD);  Surgeon: Shirley Friar, MD;  Location: [facility] ENDOSCOPY;  Service: Endoscopy;   Laterality: N/A;  . Permanent pacemaker generator change N/A 05/02/2012    Procedure: PERMANENT PACEMAKER GENERATOR CHANGE;  Surgeon: Duke Salvia, MD;  Location: [facility] CATH LAB;  Service: Cardiovascular;  Laterality: N/A;   Social History:   reports that she has never smoked. She has never used smokeless tobacco. She reports that she does not drink alcohol or use illicit drugs.  Family History  Problem Relation Age of Onset  . Hyperlipidemia    . Hypertension    . Osteoporosis    . Stroke Father   . Congestive Heart Failure Brother   . Congestive Heart Failure Brother     Medications: Patient's Medications  New Prescriptions   No medications on file  Previous Medications   AMOXICILLIN (AMOXIL) 500 MG CAPSULE    Take 2,000 mg by mouth once as needed (prior to dental appointments).   ATENOLOL (TENORMIN) 50 MG TABLET    Take 0.5 tablets (25 mg total) by mouth daily.   ATORVASTATIN (LIPITOR) 10 MG TABLET       DIPHENHYD-HYDROCORT-NYSTATIN (  FIRST-DUKES MOUTHWASH MT)    Use as directed in the mouth or throat. SWISH AND SPIT 5 ML BY MOUTH BEFORE MEALS AND AT BEDTIME FOR 21 DAY'S   FUROSEMIDE (LASIX) 40 MG TABLET    Take 40 mg by mouth. TAKE ONE TAB BY MOUTH EVERY DAY   GERIATRIC MULTIVITAMINS-MINERALS (ELDERTONIC/GEVRABON) ELIX    Take 15 mLs by mouth daily.   HYDROCODONE-ACETAMINOPHEN (NORCO) 7.5-325 MG PER TABLET    Take one tablet by mouth four times daily for pain   ISOSORBIDE MONONITRATE (IMDUR) 30 MG 24 HR TABLET    Take 0.5 tablets (15 mg total) by mouth daily.   LEVOTHYROXINE (SYNTHROID, LEVOTHROID) 100 MCG TABLET       LEVOTHYROXINE (SYNTHROID, LEVOTHROID) 125 MCG TABLET    Take 1 tablet (125 mcg total) by mouth daily before breakfast.   LORAZEPAM (ATIVAN) 1 MG TABLET    Take one tablet by mouth every 4 hours as needed for anxiety or agitation   OMEPRAZOLE (PRILOSEC) 20 MG CAPSULE    Take 20 mg by mouth daily. TAKE 1 CAP BY MOUTH EVERY DAY TAKE 30-60 MINUTES BEFORE EATING    OXYCODONE (ROXICODONE INTENSOL) 20 MG/ML CONCENTRATED SOLUTION    Take 5 mg by mouth every 4 (four) hours as needed for severe pain.   PANTOPRAZOLE (PROTONIX) 40 MG TABLET    Take 1 tablet (40 mg total) by mouth daily.   POLYETHYLENE GLYCOL (MIRALAX / GLYCOLAX) PACKET    Take 17 g by mouth daily. FILL CAP TO 17 GM MARK, MIX WITH 4 OUNCES OF FLUID AND TAKE BY MOUTH EVERY DAY   SPIRONOLACTONE (ALDACTONE) 25 MG TABLET    Take 25 mg by mouth daily.   VITAMIN C (VITAMIN C) 500 MG TABLET    Take 1 tablet (500 mg total) by mouth daily.  Modified Medications   No medications on file  Discontinued Medications   No medications on file     Physical Exam: Physical Exam  Constitutional: She is oriented to person, place, and time. No distress.  frail  HENT:  Head: Normocephalic and atraumatic.  Partial deafness. Inflamed tongue no lesions noted-improved.   Eyes: Pupils are equal, round, and reactive to light.  Neck: Normal range of motion. Neck supple. No JVD present. No tracheal deviation present. No thyromegaly present.  Cardiovascular: Normal rate, regular rhythm, normal heart sounds and intact distal pulses.  Exam reveals no gallop and no friction rub.   No murmur heard. Pulmonary/Chest: Effort normal. No respiratory distress. She has no wheezes. She has rales. She exhibits no tenderness.  Moist rales diffused-less. Decreased breath sounds R+L  Abdominal: Bowel sounds are normal. She exhibits no distension and no mass. There is no tenderness.  Musculoskeletal: Normal range of motion. She exhibits edema. She exhibits no tenderness.  Using power chair and cane. BLE edema 2+ LLE>RLE trace  Lymphadenopathy:    She has no cervical adenopathy.  Neurological: She is alert and oriented to person, place, and time. No cranial nerve deficit.  Skin: Skin is warm and dry. No rash noted. No erythema. No pallor.  Pigmented and scaly BLE. Erythema/heat/swelling LLE mainly in foot. RLE edema trace.       Psychiatric: She has a normal mood and affect. Her behavior is normal. Thought content normal. Cognition and memory are impaired. She exhibits abnormal recent memory.    Filed Vitals:   05/08/14 1222  BP: 110/70  Pulse: 72  Temp: 99.6 F (37.6 C)  TempSrc: Tympanic  Resp:  20      Labs reviewed: Basic Metabolic Panel:  Recent Labs  45/40/98  01/19/14 1755  01/21/14 0235 01/22/14 0035 01/22/14 2350  02/05/14  03/12/14 03/26/14 04/30/14  NA 136*  < >  --   < > 136* 136* 139  < > 142  < > 140 140 136*  K 4.2  < >  --   < > 5.0 5.1 5.5*  < > 3.6  < > 4.1 4.0 4.2  CL  --   < >  --   < > 104 105 108  --   --   --   --   --   --   CO2  --   < >  --   < > 17* 17* 17*  --   --   --   --   --   --   GLUCOSE  --   < >  --   < > 82 93 126*  --   --   --   --   --   --   BUN 43*  < >  --   < > 43* 38* 36*  < > 25*  < > 21 35* 54*  CREATININE 1.6*  < >  --   < > 1.68* 1.62* 1.95*  < > 1.4*  < > 1.2* 1.2* 1.2*  CALCIUM  --   < >  --   < > 8.3* 8.5 8.5  --   --   --   --   --   --   TSH 2.01  --  10.510*  --   --   --   --   --  2.68  --   --   --   --   < > = values in this interval not displayed. Liver Function Tests:  Recent Labs  01/10/14 1541 01/20/14 0720 01/21/14 0235 01/29/14  AST 26 86* 45* 34  ALT 15 52* 36* 36*  ALKPHOS 54 89 73 87  BILITOT 0.8 0.6 0.5  --   PROT 6.7 6.3 5.5*  --   ALBUMIN 3.7 3.0* 2.7*  --    No results for input(s): LIPASE, AMYLASE in the last 8760 hours. No results for input(s): AMMONIA in the last 8760 hours. CBC:  Recent Labs  01/10/14 1541  01/21/14 0235 01/22/14 0035 01/22/14 2350 01/29/14  WBC 6.4  < > 7.3 6.6 6.5 10.7  NEUTROABS 4.6  --   --   --   --   --   HGB 10.0*  < > 10.7* 11.2* 11.4* 11.5*  HCT 29.9*  < > 32.2* 34.5* 35.7* 36  MCV 92.4  < > 87.0 88.2 92.5  --   PLT 185.0  < > 184 175 174 160  < > = values in this interval not displayed. Lipid Panel:  Recent Labs  05/29/13  CHOL 136  HDL 43  LDLCALC 74  TRIG 93     Past Procedures:  01/19/14 CXR   IMPRESSION:  1. Stable cardiomegaly. Pulmonary vascularity is normal. Cardiac  pacer noted with lead tips in the right atrium and right ventricle.  2. Mild bibasilar subsegmental axis with small bilateral pleural  effusions.  3. Stable lumbar compression fractures. Diffuse thoracolumbar spine  osteopenia and degenerative change.    Assessment/Plan Malaise and fatigue Will obtain CBC, CMP, BNP, UA C/S-observe the patient.    Cough Afebrile, no O2 desaturation, will obtain CXR   Adjustment  disorder with mixed anxiety and depressed mood 04/27/14: no longer electric scooter 2nd to unsafe operation-son is in SNF due to health problem-she stated she would rather die-Lexapro  daily.  05/08/14 seems better   Atrial fibrillation Heart rate is in control, takes Atenolol  daily, has pacemaker which is functional. Stopped Elliquis due to GI bleed.     Cellulitis of leg, left LLE warmth, swelling, and tenderness-04/26/14 venous US LLE negative fro deep venous thrombosis at major veins of left lower extremity. Doxycycline  bid x 14 days. Triamcinolone cream nightly LLE. Observe.  05/08/14 improved.     Chronic kidney disease, stage III (moderate) 02/19/14 Bun/creat 23/1.27 03/12/14 Bun/creat 21/1.22 03/26/14 Bun/creat 35/1.21 05/08/14 update CMP/BNP     Constipation MiraLax daily, controlled.     DIASTOLIC HEART FAILURE, CHRONIC 02/05/14 BNP 460.3 02/16/14 BNP 421.5 02/19/14 BNP 489.2 03/12/14 BNP 424.6 03/19/14 CXR enlarged heart with moderate congestive heart failure, airspace opacities lower lungs bilaterally consistent with superimposed inflammatory process or pneumonia as well as atelectatic change. Additional Furosemide  x 3 days. Then dc Kcl-adding Spironolactone  daily.  03/30/14 clinically stable, continue Furosemide  and Spironolactone  daily 05/08/14 CMP/BNP. Clinically compensated.     Edema Overall  less edema     Essential hypertension Controlled. Takes Imdur and Atenolol.     Hypothyroidism Takes Synthroid daily.  12/28/13 TSH 2.01 02/05/14 TSH 2.68      Insomnia Improved since Lorazepam 0.5mg  qhs 03/24/14 and Lexapro  daily.  Lorazepam 0.5mg  q4h prn available to her for anxiety associated with SOB      Family/ Staff Communication: observe the patient. Palliative care.   Goals of Care: SNF  Labs/tests ordered: CBC, CMP, BNP, CXR UA C/S

## 2014-05-08 NOTE — Assessment & Plan Note (Signed)
Overall less edema

## 2014-05-08 NOTE — Assessment & Plan Note (Signed)
04/27/14: no longer electric scooter 2nd to unsafe operation-son is in SNF due to health problem-she stated she would rather die-Lexapro 5mg  daily.  05/08/14 seems better

## 2014-05-08 NOTE — Assessment & Plan Note (Signed)
Controlled. Takes Imdur and Atenolol.   

## 2014-05-08 NOTE — Assessment & Plan Note (Signed)
MiraLax daily, controlled.  

## 2014-05-08 NOTE — Assessment & Plan Note (Signed)
Improved since Lorazepam 0.5mg  qhs 03/24/14 and Lexapro 5mg  daily.  Lorazepam 0.5mg  q4h prn available to her for anxiety associated with SOB

## 2014-05-08 NOTE — Assessment & Plan Note (Signed)
Takes Synthroid 125mcg daily.  12/28/13 TSH 2.01 02/05/14 TSH 2.68  

## 2014-05-08 NOTE — Assessment & Plan Note (Signed)
Heart rate is in control, takes Atenolol 25mg daily, has pacemaker which is functional. Stopped Elliquis due to GI bleed.     

## 2014-05-09 LAB — BASIC METABOLIC PANEL
BUN: 60 mg/dL — AB (ref 4–21)
Creatinine: 1.4 mg/dL — AB (ref 0.5–1.1)
GLUCOSE: 98 mg/dL
POTASSIUM: 4.3 mmol/L (ref 3.4–5.3)
SODIUM: 137 mmol/L (ref 137–147)

## 2014-05-09 LAB — CBC AND DIFFERENTIAL
HCT: 41 % (ref 36–46)
Hemoglobin: 13.4 g/dL (ref 12.0–16.0)
PLATELETS: 206 10*3/uL (ref 150–399)
WBC: 12.7 10*3/mL

## 2014-05-09 LAB — HEPATIC FUNCTION PANEL
ALT: 23 U/L (ref 7–35)
AST: 17 U/L (ref 13–35)
Alkaline Phosphatase: 91 U/L (ref 25–125)
Bilirubin, Total: 0.6 mg/dL

## 2014-05-15 ENCOUNTER — Non-Acute Institutional Stay (SKILLED_NURSING_FACILITY): Payer: Medicare Other | Admitting: Nurse Practitioner

## 2014-05-15 ENCOUNTER — Encounter: Payer: Self-pay | Admitting: Nurse Practitioner

## 2014-05-15 DIAGNOSIS — E039 Hypothyroidism, unspecified: Secondary | ICD-10-CM

## 2014-05-15 DIAGNOSIS — B370 Candidal stomatitis: Secondary | ICD-10-CM

## 2014-05-15 DIAGNOSIS — N183 Chronic kidney disease, stage 3 unspecified: Secondary | ICD-10-CM

## 2014-05-15 DIAGNOSIS — R609 Edema, unspecified: Secondary | ICD-10-CM

## 2014-05-15 DIAGNOSIS — J180 Bronchopneumonia, unspecified organism: Secondary | ICD-10-CM | POA: Insufficient documentation

## 2014-05-15 DIAGNOSIS — I5032 Chronic diastolic (congestive) heart failure: Secondary | ICD-10-CM

## 2014-05-15 DIAGNOSIS — I10 Essential (primary) hypertension: Secondary | ICD-10-CM

## 2014-05-15 DIAGNOSIS — I482 Chronic atrial fibrillation, unspecified: Secondary | ICD-10-CM

## 2014-05-15 DIAGNOSIS — G4700 Insomnia, unspecified: Secondary | ICD-10-CM

## 2014-05-15 DIAGNOSIS — F4323 Adjustment disorder with mixed anxiety and depressed mood: Secondary | ICD-10-CM

## 2014-05-15 DIAGNOSIS — R627 Adult failure to thrive: Secondary | ICD-10-CM

## 2014-05-15 DIAGNOSIS — K5900 Constipation, unspecified: Secondary | ICD-10-CM

## 2014-05-15 NOTE — Assessment & Plan Note (Signed)
Magic mouth wash 5ml to paint oral cavity ac and hs.

## 2014-05-15 NOTE — Assessment & Plan Note (Signed)
Heart rate is in control, takes Atenolol 25mg daily, has pacemaker which is functional. Stopped Elliquis due to GI bleed.     

## 2014-05-15 NOTE — Assessment & Plan Note (Signed)
MiraLax daily, controlled.  

## 2014-05-15 NOTE — Progress Notes (Signed)
Patient ID: Sabrina Bishop, female   DOB: 12/24/1916, 81 y.o.   MRN: [id number]   Code Status: DNR, MOST  Allergies  Allergen Reactions  . Vioxx [Rofecoxib] Other (See Comments)    Pt doesn't remember  . Adhesive [Tape] Rash    Chief Complaint  Patient presents with  . Medical Management of Chronic Issues  . Acute Visit    SOB    HPI: Patient is a 81 y.o. female seen in the SNF at [facility] today for evaluation of PNA, CHF, CKD, and other chronic medical conditions.  Problem List Items Addressed This Visit    Oral candidiasis    Magic mouth wash 5ml to paint oral cavity ac and hs.       Insomnia    Improved since Lorazepam 0.5mg  qhs 03/24/14 and dc Lexapro 5mg  daily.  Lorazepam 0.5mg  q4h prn available to her for anxiety associated with SOB      Hypothyroidism (Chronic)    Takes Synthroid daily.  12/28/13 TSH 2.01 02/05/14 TSH 2.68       FTT (failure to thrive) in adult    Hospice service.       Essential hypertension (Chronic)    Controlled. Takes Imdur and Atenolol.         Edema (Chronic)    No apparent edema       DIASTOLIC HEART FAILURE, CHRONIC (Chronic)    0/19/15 BNP 460.3 02/16/14 BNP 421.5 02/19/14 BNP 489.2 03/12/14 BNP 424.6 03/19/14 CXR enlarged heart with moderate congestive heart failure, airspace opacities lower lungs bilaterally consistent with superimposed inflammatory process or pneumonia as well as atelectatic change. Additional Furosemide 20mg  x 3 days. Then dc Kcl-adding Spironolactone 25mg  daily.  03/30/14 clinically stable, continue Furosemide 40mg  and Spironolactone 25mg  daily 04/30/14 BNP 412.5 05/08/14 CXR cardiomegaly without evident congestive heart failure. R basilar bronchopneumonia.  05/09/14 BNP 330.8 05/15/14 no apparent edema, mouth is dry, very limited oral intake-dc Furosemide and Spironolactone.         Constipation    MiraLax daily, controlled.         Chronic kidney disease, stage III (moderate)      02/19/14 Bun/creat 23/1.27 03/12/14 Bun/creat 21/1.22 03/26/14 Bun/creat 35/1.21 05/09/14 Bun/creat 60/1.36      Bronchopneumonia - Primary    05/08/14 CXR cardiomegaly without evident congestive heart failure. R basilar bronchopneumonia. 10 day course of Avelox 400mg  bid daily to be completed.        Atrial fibrillation (Chronic)    Heart rate is in control, takes Atenolol 25mg  daily, has pacemaker which is functional. Stopped Elliquis due to GI bleed.        Adjustment disorder with mixed anxiety and depressed mood    04/27/14: no longer electric scooter 2nd to unsafe operation-son is in SNF due to health problem-she stated she would rather die-Lexapro 5mg  daily.  05/08/14 seems better 05/15/14 mood is stable. Dc Lexapro          Review of Systems:  Review of Systems  Constitutional: Positive for weight loss and malaise/fatigue. Negative for fever, chills and diaphoresis.  HENT: Positive for hearing loss. Negative for congestion, ear discharge, ear pain, nosebleeds, sore throat and tinnitus.   Eyes: Negative for blurred vision, double vision, photophobia, pain, discharge and redness.  Respiratory: Positive for cough and shortness of breath. Negative for hemoptysis, sputum production, wheezing and stridor.   Cardiovascular: Positive for leg swelling and PND. Negative for chest pain, palpitations, orthopnea and claudication.  Gastrointestinal: Negative for  heartburn, nausea, vomiting, abdominal pain, diarrhea, constipation, blood in stool and melena.  Genitourinary: Negative for dysuria, urgency, frequency, hematuria and flank pain.  Musculoskeletal: Negative for myalgias, back pain, joint pain, falls and neck pain.  Skin: Negative for itching and rash.  Neurological: Positive for weakness. Negative for dizziness, tingling, tremors, sensory change, speech change, focal weakness, seizures, loss of consciousness and headaches.  Endo/Heme/Allergies: Negative for environmental allergies  and polydipsia. Does not bruise/bleed easily.  Psychiatric/Behavioral: Positive for depression and memory loss. Negative for suicidal ideas, hallucinations and substance abuse. The patient is nervous/anxious. The patient does not have insomnia.      Past Medical History  Diagnosis Date  . Hypothyroidism   . OA (osteoarthritis)   . HTN (hypertension)   . Diastolic dysfunction   . AV BLOCK, COMPLETE 03/26/2009    Qualifier: Diagnosis of  By: Graciela HusbandsKlein, MD, Ruthann CancerFACC, Ty HiltsSteven Cochran   . Pacemaker-mdt DDD 12/16/2010  . Keratoconus, unspecified   . Disturbance of salivary secretion   . GERD (gastroesophageal reflux disease)   . Osteoarthrosis, unspecified whether generalized or localized, unspecified site   . Pain in joint, ankle and foot   . Spinal stenosis, lumbar region, without neurogenic claudication   . Lumbago   . Trigger finger (acquired)   . Abnormality of gait   . Edema   . Dysphagia, unspecified(787.20)   . Seborrheic keratosis 05/2012  . Other atopic dermatitis and related conditions 03/2012  . Unspecified diastolic heart failure 02/16/2012  . Mixed hyperlipidemia 08/2011  . Chronic kidney disease, stage II (mild) 08/2011  . Hypoxemia 08/2011  . Enthesopathy of hip region 01/2011  . Keratoconjunctivitis 2011  . Atrial fibrillation 02/15/2013  . Unspecified venous (peripheral) insufficiency 2010  . Pain in joint, shoulder region 2015    left  . Keratoacanthoma of hand 2015    left  . Actinic keratoses 2015    right arm and left cheek   Past Surgical History  Procedure Laterality Date  . Total knee arthroplasty  2005    left  . Abdominal hysterectomy  1950  . Pacemaker generator change  04/2012    Dr. Graciela HusbandsKlein  . Pacemaker placement  2004  . Total knee arthroplasty  1995    right  . Cataract extraction w/ intraocular lens implant  2012    right  . Esophagogastroduodenoscopy N/A 01/20/2014    Procedure: ESOPHAGOGASTRODUODENOSCOPY (EGD);  Surgeon: Shirley FriarVincent C. Schooler, MD;   [facility];  Service: Endoscopy;  Laterality: N/A;  . Permanent pacemaker generator change N/A 05/02/2012    Procedure: PERMANENT PACEMAKER GENERATOR CHANGE;  Surgeon: Duke SalviaSteven C Klein, MD;  [facility];  Service: Cardiovascular;  Laterality: N/A;   Social History:   reports that she has never smoked. She has never used smokeless tobacco. She reports that she does not drink alcohol or use illicit drugs.  Family History  Problem Relation Age of Onset  . Hyperlipidemia    . Hypertension    . Osteoporosis    . Stroke Father   . Congestive Heart Failure Brother   . Congestive Heart Failure Brother     Medications: Patient's Medications  New Prescriptions   No medications on file  Previous Medications   AMOXICILLIN (AMOXIL) 500 MG CAPSULE    Take 2,000 mg by mouth once as needed (prior to dental appointments).   ATENOLOL (TENORMIN) 50 MG TABLET    Take 0.5 tablets (25 mg total) by mouth daily.   DIPHENHYD-HYDROCORT-NYSTATIN (FIRST-DUKES MOUTHWASH MT)  Use as directed in the mouth or throat. SWISH AND SPIT 5 ML BY MOUTH BEFORE MEALS AND AT BEDTIME FOR 21 DAY'S   HYDROCODONE-ACETAMINOPHEN (NORCO) 7.5-325 MG PER TABLET    Take one tablet by mouth four times daily for pain   ISOSORBIDE MONONITRATE (IMDUR) 30 MG 24 HR TABLET    Take 0.5 tablets (15 mg total) by mouth daily.   LEVOTHYROXINE (SYNTHROID, LEVOTHROID) 100 MCG TABLET       LEVOTHYROXINE (SYNTHROID, LEVOTHROID) 125 MCG TABLET    Take 1 tablet (125 mcg total) by mouth daily before breakfast.   LORAZEPAM (ATIVAN) 1 MG TABLET    Take one tablet by mouth every 4 hours as needed for anxiety or agitation   OMEPRAZOLE (PRILOSEC) 20 MG CAPSULE    Take 20 mg by mouth daily. TAKE 1 CAP BY MOUTH EVERY DAY TAKE 30-60 MINUTES BEFORE EATING   OXYCODONE (ROXICODONE INTENSOL) 20 MG/ML CONCENTRATED SOLUTION    Take 5 mg by mouth every 4 (four) hours as needed for severe pain.   PANTOPRAZOLE (PROTONIX) 40 MG TABLET    Take 1  tablet (40 mg total) by mouth daily.   POLYETHYLENE GLYCOL (MIRALAX / GLYCOLAX) PACKET    Take 17 g by mouth daily. FILL CAP TO 17 GM MARK, MIX WITH 4 OUNCES OF FLUID AND TAKE BY MOUTH EVERY DAY  Modified Medications   No medications on file  Discontinued Medications   ATORVASTATIN (LIPITOR) 10 MG TABLET       FUROSEMIDE (LASIX) 40 MG TABLET    Take 40 mg by mouth. TAKE ONE TAB BY MOUTH EVERY DAY   GERIATRIC MULTIVITAMINS-MINERALS (ELDERTONIC/GEVRABON) ELIX    Take 15 mLs by mouth daily.   SPIRONOLACTONE (ALDACTONE) 25 MG TABLET    Take 25 mg by mouth daily.   VITAMIN C (VITAMIN C) 500 MG TABLET    Take 1 tablet (500 mg total) by mouth daily.     Physical Exam: Physical Exam  Constitutional: She is oriented to person, place, and time. No distress.  frail  HENT:  Head: Normocephalic and atraumatic.  Partial deafness. Inflamed tongue no lesions noted-improved.   Eyes: Pupils are equal, round, and reactive to light.  Neck: Normal range of motion. Neck supple. No JVD present. No tracheal deviation present. No thyromegaly present.  Cardiovascular: Normal rate, regular rhythm, normal heart sounds and intact distal pulses.  Exam reveals no gallop and no friction rub.   No murmur heard. Pulmonary/Chest: Effort normal. No respiratory distress. She has no wheezes. She has rales. She exhibits no tenderness.  Moist rales diffused-less. Decreased breath sounds R+L  Abdominal: Bowel sounds are normal. She exhibits no distension and no mass. There is no tenderness.  Musculoskeletal: Normal range of motion. She exhibits edema. She exhibits no tenderness.  Using power chair and cane. BLE edema 2+ LLE>RLE trace  Lymphadenopathy:    She has no cervical adenopathy.  Neurological: She is alert and oriented to person, place, and time. No cranial nerve deficit.  Skin: Skin is warm and dry. No rash noted. No erythema. No pallor.  Pigmented and scaly BLE. Erythema/heat/swelling LLE mainly in foot. RLE edema  trace.     Psychiatric: She has a normal mood and affect. Her behavior is normal. Thought content normal. Cognition and memory are impaired. She exhibits abnormal recent memory.    Filed Vitals:   05/15/14 1243  BP: 137/76  Pulse: 60  Temp: 98.5 F (36.9 C)  TempSrc: Tympanic  Resp: 18  Labs reviewed: Basic Metabolic Panel:  Recent Labs  95/62/13  01/19/14 1755  01/21/14 0235 01/22/14 0035 01/22/14 2350  02/05/14  03/26/14 04/30/14 05/09/14  NA 136*  < >  --   < > 136* 136* 139  < > 142  < > 140 136* 137  K 4.2  < >  --   < > 5.0 5.1 5.5*  < > 3.6  < > 4.0 4.2 4.3  CL  --   < >  --   < > 104 105 108  --   --   --   --   --   --   CO2  --   < >  --   < > 17* 17* 17*  --   --   --   --   --   --   GLUCOSE  --   < >  --   < > 82 93 126*  --   --   --   --   --   --   BUN 43*  < >  --   < > 43* 38* 36*  < > 25*  < > 35* 54* 60*  CREATININE 1.6*  < >  --   < > 1.68* 1.62* 1.95*  < > 1.4*  < > 1.2* 1.2* 1.4*  CALCIUM  --   < >  --   < > 8.3* 8.5 8.5  --   --   --   --   --   --   TSH 2.01  --  10.510*  --   --   --   --   --  2.68  --   --   --   --   < > = values in this interval not displayed. Liver Function Tests:  Recent Labs  01/10/14 1541 01/20/14 0720 01/21/14 0235 01/29/14 05/09/14  AST 26 86* 45* 34 17  ALT 15 52* 36* 36* 23  ALKPHOS 54 89 73 87 91  BILITOT 0.8 0.6 0.5  --   --   PROT 6.7 6.3 5.5*  --   --   ALBUMIN 3.7 3.0* 2.7*  --   --    No results for input(s): LIPASE, AMYLASE in the last 8760 hours. No results for input(s): AMMONIA in the last 8760 hours. CBC:  Recent Labs  01/10/14 1541  01/21/14 0235 01/22/14 0035 01/22/14 2350 01/29/14 05/09/14  WBC 6.4  < > 7.3 6.6 6.5 10.7 12.7  NEUTROABS 4.6  --   --   --   --   --   --   HGB 10.0*  < > 10.7* 11.2* 11.4* 11.5* 13.4  HCT 29.9*  < > 32.2* 34.5* 35.7* 36 41  MCV 92.4  < > 87.0 88.2 92.5  --   --   PLT 185.0  < > 184 175 174 160 206  < > = values in this interval not displayed. Lipid  Panel:  Recent Labs  05/29/13  CHOL 136  HDL 43  LDLCALC 74  TRIG 93    Past Procedures:  01/19/14 CXR   IMPRESSION:  1. Stable cardiomegaly. Pulmonary vascularity is normal. Cardiac  pacer noted with lead tips in the right atrium and right ventricle.  2. Mild bibasilar subsegmental axis with small bilateral pleural  effusions.  3. Stable lumbar compression fractures. Diffuse thoracolumbar spine  osteopenia and degenerative change.   05/08/14 CXR cardiomegaly without evident congestive hart failure, right basilar  bronchopneumonia.   Assessment/Plan DIASTOLIC HEART FAILURE, CHRONIC 0/19/15 BNP 460.3 02/16/14 BNP 421.5 02/19/14 BNP 489.2 03/12/14 BNP 424.6 03/19/14 CXR enlarged heart with moderate congestive heart failure, airspace opacities lower lungs bilaterally consistent with superimposed inflammatory process or pneumonia as well as atelectatic change. Additional Furosemide 20mg  x 3 days. Then dc Kcl-adding Spironolactone 25mg  daily.  03/30/14 clinically stable, continue Furosemide 40mg  and Spironolactone 25mg  daily 04/30/14 BNP 412.5 05/08/14 CXR cardiomegaly without evident congestive heart failure. R basilar bronchopneumonia.  05/09/14 BNP 330.8 05/15/14 no apparent edema, mouth is dry, very limited oral intake-dc Furosemide and Spironolactone.      Bronchopneumonia 05/08/14 CXR cardiomegaly without evident congestive heart failure. R basilar bronchopneumonia. 10 day course of Avelox 400mg  bid daily to be completed.     Chronic kidney disease, stage III (moderate) 02/19/14 Bun/creat 23/1.27 03/12/14 Bun/creat 21/1.22 03/26/14 Bun/creat 35/1.21 05/09/14 Bun/creat 60/1.36   Adjustment disorder with mixed anxiety and depressed mood 04/27/14: no longer electric scooter 2nd to unsafe operation-son is in SNF due to health problem-she stated she would rather die-Lexapro 5mg  daily.  05/08/14 seems better 05/15/14 mood is stable. Dc Lexapro    Atrial fibrillation Heart rate  is in control, takes Atenolol 25mg  daily, has pacemaker which is functional. Stopped Elliquis due to GI bleed.     Constipation MiraLax daily, controlled.      Edema No apparent edema    Essential hypertension Controlled. Takes Imdur and Atenolol.      Hypothyroidism Takes Synthroid daily.  12/28/13 TSH 2.01 02/05/14 TSH 2.68    Insomnia Improved since Lorazepam 0.5mg  qhs 03/24/14 and dc Lexapro 5mg  daily.  Lorazepam 0.5mg  q4h prn available to her for anxiety associated with SOB   FTT (failure to thrive) in adult Hospice service.    Oral candidiasis Magic mouth wash 5ml to paint oral cavity ac and hs.      Family/ Staff Communication: observe the patient. Hospice Service.   Goals of Care: SNF  Labs/tests ordered: none

## 2014-05-15 NOTE — Assessment & Plan Note (Addendum)
0/19/15 BNP 460.3 02/16/14 BNP 421.5 02/19/14 BNP 489.2 03/12/14 BNP 424.6 03/19/14 CXR enlarged heart with moderate congestive heart failure, airspace opacities lower lungs bilaterally consistent with superimposed inflammatory process or pneumonia as well as atelectatic change. Additional Furosemide 20mg  x 3 days. Then dc Kcl-adding Spironolactone 25mg  daily.  03/30/14 clinically stable, continue Furosemide 40mg  and Spironolactone 25mg  daily 04/30/14 BNP 412.5 05/08/14 CXR cardiomegaly without evident congestive heart failure. R basilar bronchopneumonia.  05/09/14 BNP 330.8 05/15/14 no apparent edema, mouth is dry, very limited oral intake-dc Furosemide and Spironolactone.

## 2014-05-15 NOTE — Assessment & Plan Note (Addendum)
No apparent edema 

## 2014-05-15 NOTE — Assessment & Plan Note (Addendum)
04/27/14: no longer electric scooter 2nd to unsafe operation-son is in SNF due to health problem-she stated she would rather die-Lexapro 5mg  daily.  05/08/14 seems better 05/15/14 mood is stable. Dc Lexapro

## 2014-05-15 NOTE — Assessment & Plan Note (Signed)
Takes Synthroid 125mcg daily.  12/28/13 TSH 2.01 02/05/14 TSH 2.68  

## 2014-05-15 NOTE — Assessment & Plan Note (Signed)
05/08/14 CXR cardiomegaly without evident congestive heart failure. R basilar bronchopneumonia. 10 day course of Avelox 400mg  bid daily to be completed.

## 2014-05-15 NOTE — Assessment & Plan Note (Signed)
02/19/14 Bun/creat 23/1.27 03/12/14 Bun/creat 21/1.22 03/26/14 Bun/creat 35/1.21 05/09/14 Bun/creat 60/1.36

## 2014-05-15 NOTE — Assessment & Plan Note (Signed)
Hospice service.

## 2014-05-15 NOTE — Assessment & Plan Note (Signed)
Controlled. Takes Imdur and Atenolol.   

## 2014-05-15 NOTE — Assessment & Plan Note (Addendum)
Improved since Lorazepam 0.5mg  qhs 03/24/14 and dc Lexapro 5mg  daily.  Lorazepam 0.5mg  q4h prn available to her for anxiety associated with SOB

## 2014-05-21 DEATH — deceased

## 2014-07-05 ENCOUNTER — Encounter: Payer: Self-pay | Admitting: Cardiology

## 2014-07-17 ENCOUNTER — Encounter: Payer: Self-pay | Admitting: Internal Medicine

## 2014-08-31 ENCOUNTER — Encounter: Payer: Self-pay | Admitting: Internal Medicine

## 2014-12-07 NOTE — Progress Notes (Signed)
This encounter was created in error - please disregard.

## 2015-04-16 IMAGING — CR DG CHEST 2V
2 series · 2 of 2 positions shown · non-contrast
Comparison: 12/07/2013.

CLINICAL DATA: Weakness.  Shortness of breath.  Swelling in legs.

EXAM:
CHEST  2 VIEW

[w chest lat]
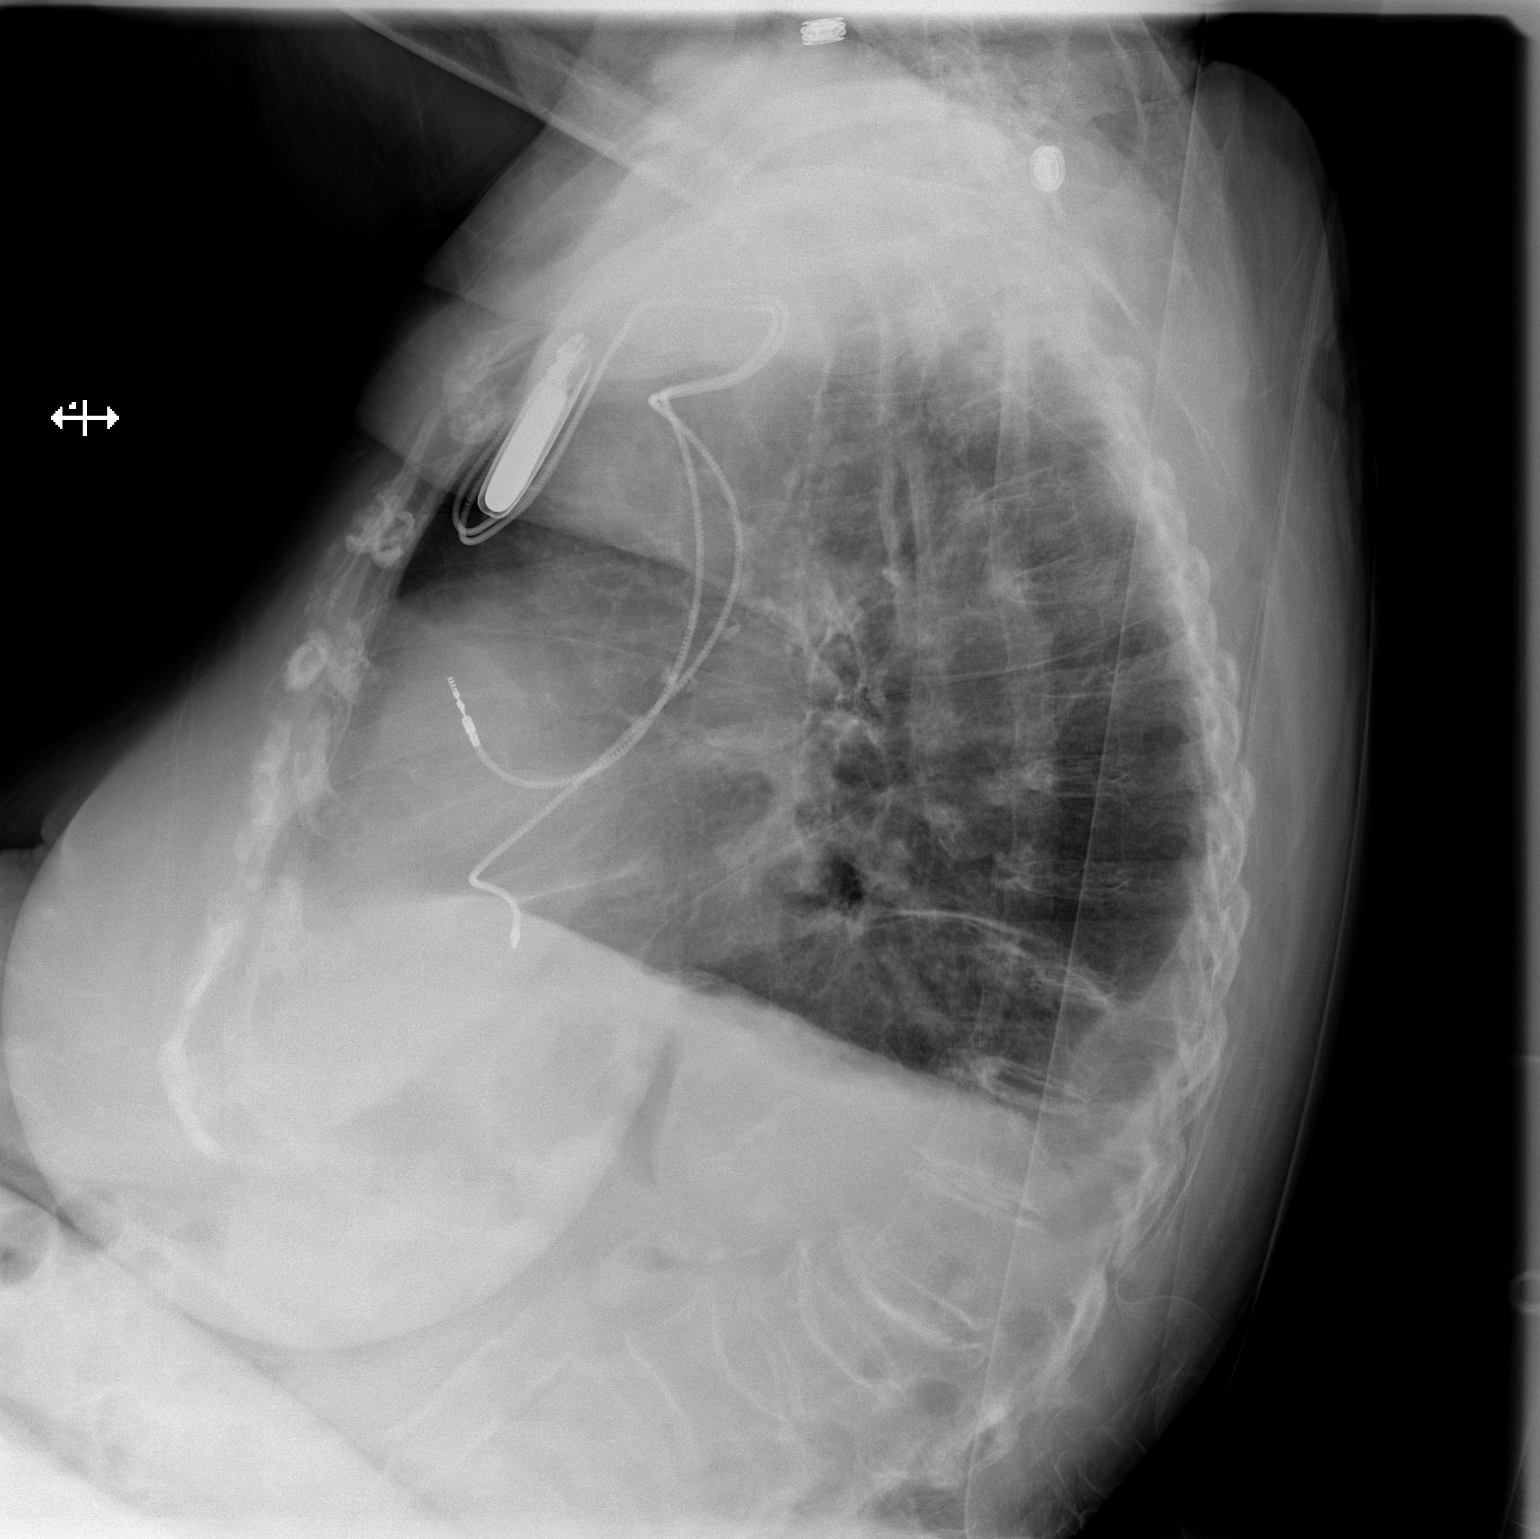

[x chest ap]
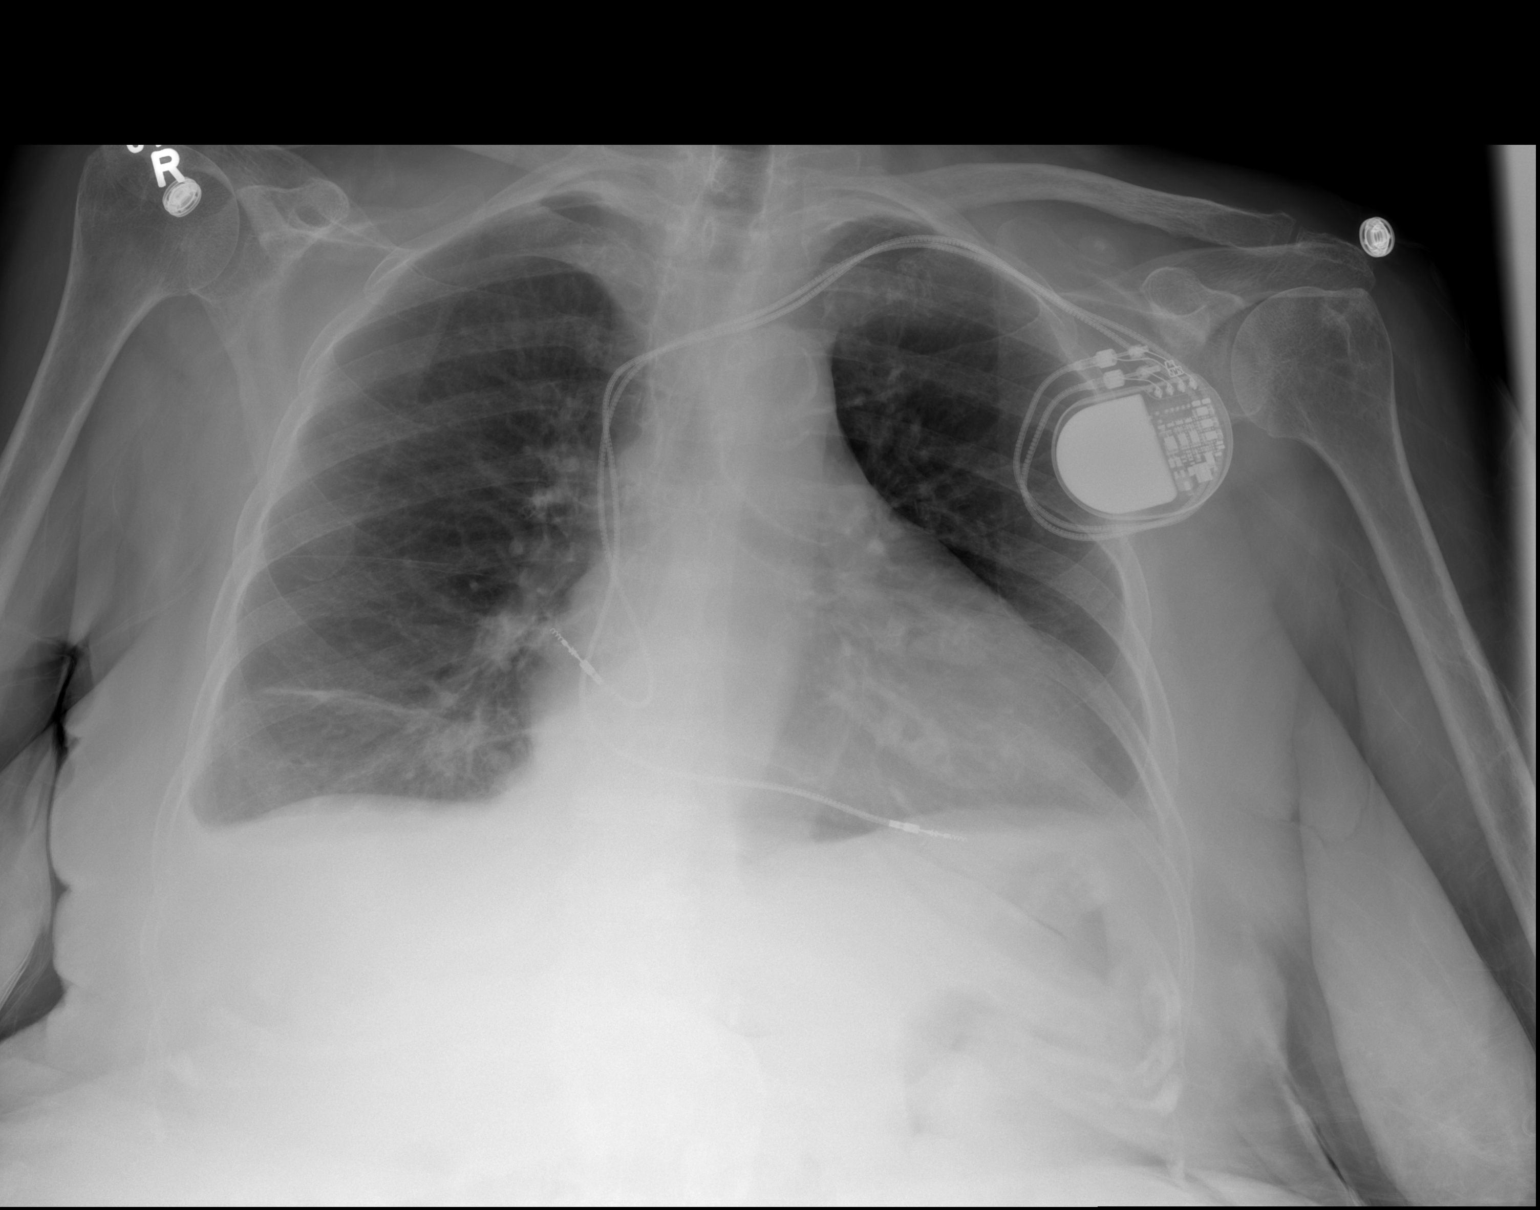

[2 of 2 positions shown; findings below may reference images not displayed]

FINDINGS: Mediastinum hilar structures are normal. Cardiac pacer and lead tips
in right atrium and right ventricle. Cardiomegaly with normal
pulmonary vascularity. Right base subsegmental atelectasis. Small
bilateral pleural effusions. No pneumothorax. No acute bony
abnormality. Diffuse osteopenia degenerative changes of the
thoracolumbar spine with multiple stable lumbar compression
fractures.
IMPRESSION: 1. Stable cardiomegaly. Pulmonary vascularity is normal. Cardiac
pacer noted with lead tips in the right atrium and right ventricle.
2. Mild bibasilar subsegmental axis with small bilateral pleural
effusions.
3. Stable lumbar compression fractures. Diffuse thoracolumbar spine
osteopenia and degenerative change.
